# Patient Record
Sex: Male | Born: 1955 | Race: White | Hispanic: No | Marital: Single
Health system: Southern US, Community
[De-identification: ages and names within clinical notes are randomized; demographics above are authoritative.]

## PROBLEM LIST (undated history)

## (undated) DIAGNOSIS — J9621 Acute and chronic respiratory failure with hypoxia: Secondary | ICD-10-CM

## (undated) DIAGNOSIS — I469 Cardiac arrest, cause unspecified: Secondary | ICD-10-CM

## (undated) DIAGNOSIS — R6520 Severe sepsis without septic shock: Secondary | ICD-10-CM

## (undated) DIAGNOSIS — A419 Sepsis, unspecified organism: Secondary | ICD-10-CM

## (undated) DIAGNOSIS — J181 Lobar pneumonia, unspecified organism: Secondary | ICD-10-CM

## (undated) DIAGNOSIS — J449 Chronic obstructive pulmonary disease, unspecified: Secondary | ICD-10-CM

## (undated) DIAGNOSIS — J189 Pneumonia, unspecified organism: Secondary | ICD-10-CM

---

## 2019-04-16 ENCOUNTER — Other Ambulatory Visit (HOSPITAL_COMMUNITY): Payer: Self-pay

## 2019-04-16 ENCOUNTER — Inpatient Hospital Stay: Admission: RE | Admit: 2019-04-16 | Payer: Self-pay | Source: Other Acute Inpatient Hospital

## 2019-04-16 ENCOUNTER — Inpatient Hospital Stay
Admission: RE | Admit: 2019-04-16 | Discharge: 2019-05-02 | Disposition: A | Discharge status: Hospice | Payer: Medicare Other | Source: Other Acute Inpatient Hospital

## 2019-04-16 ENCOUNTER — Encounter: Payer: Self-pay | Admitting: Internal Medicine

## 2019-04-16 DIAGNOSIS — T85598A Other mechanical complication of other gastrointestinal prosthetic devices, implants and grafts, initial encounter: Secondary | ICD-10-CM

## 2019-04-16 DIAGNOSIS — J189 Pneumonia, unspecified organism: Secondary | ICD-10-CM | POA: Diagnosis present

## 2019-04-16 DIAGNOSIS — R6520 Severe sepsis without septic shock: Secondary | ICD-10-CM | POA: Diagnosis present

## 2019-04-16 DIAGNOSIS — J9621 Acute and chronic respiratory failure with hypoxia: Secondary | ICD-10-CM | POA: Diagnosis present

## 2019-04-16 DIAGNOSIS — J449 Chronic obstructive pulmonary disease, unspecified: Secondary | ICD-10-CM | POA: Diagnosis not present

## 2019-04-16 DIAGNOSIS — J181 Lobar pneumonia, unspecified organism: Secondary | ICD-10-CM

## 2019-04-16 DIAGNOSIS — I469 Cardiac arrest, cause unspecified: Secondary | ICD-10-CM | POA: Diagnosis not present

## 2019-04-16 DIAGNOSIS — J9690 Respiratory failure, unspecified, unspecified whether with hypoxia or hypercapnia: Secondary | ICD-10-CM

## 2019-04-16 DIAGNOSIS — A419 Sepsis, unspecified organism: Secondary | ICD-10-CM | POA: Diagnosis present

## 2019-04-16 HISTORY — DX: Cardiac arrest, cause unspecified: I46.9

## 2019-04-16 HISTORY — DX: Acute and chronic respiratory failure with hypoxia: J96.21

## 2019-04-16 HISTORY — DX: Severe sepsis without septic shock: R65.20

## 2019-04-16 HISTORY — DX: Sepsis, unspecified organism: A41.9

## 2019-04-16 HISTORY — DX: Lobar pneumonia, unspecified organism: J18.1

## 2019-04-16 HISTORY — DX: Pneumonia, unspecified organism: J18.9

## 2019-04-16 HISTORY — DX: Chronic obstructive pulmonary disease, unspecified: J44.9

## 2019-04-16 LAB — CBC WITH DIFFERENTIAL/PLATELET
Abs Immature Granulocytes: 0.26 10*3/uL — ABNORMAL HIGH (ref 0.00–0.07)
Basophils Absolute: 0 10*3/uL (ref 0.0–0.1)
Basophils Relative: 0 %
Eosinophils Absolute: 0 10*3/uL (ref 0.0–0.5)
Eosinophils Relative: 0 %
HCT: 46.5 % (ref 39.0–52.0)
Hemoglobin: 13.7 g/dL (ref 13.0–17.0)
Immature Granulocytes: 1 %
Lymphocytes Relative: 6 %
Lymphs Abs: 1.4 10*3/uL (ref 0.7–4.0)
MCH: 28.2 pg (ref 26.0–34.0)
MCHC: 29.5 g/dL — ABNORMAL LOW (ref 30.0–36.0)
MCV: 95.7 fL (ref 80.0–100.0)
Monocytes Absolute: 1.2 10*3/uL — ABNORMAL HIGH (ref 0.1–1.0)
Monocytes Relative: 6 %
Neutro Abs: 19.4 10*3/uL — ABNORMAL HIGH (ref 1.7–7.7)
Neutrophils Relative %: 87 %
Platelets: 268 10*3/uL (ref 150–400)
RBC: 4.86 MIL/uL (ref 4.22–5.81)
RDW: 19.4 % — ABNORMAL HIGH (ref 11.5–15.5)
WBC: 22.3 10*3/uL — ABNORMAL HIGH (ref 4.0–10.5)
nRBC: 0.7 % — ABNORMAL HIGH (ref 0.0–0.2)

## 2019-04-16 LAB — COMPREHENSIVE METABOLIC PANEL
ALT: 96 U/L — ABNORMAL HIGH (ref 0–44)
AST: 92 U/L — ABNORMAL HIGH (ref 15–41)
Albumin: 2.2 g/dL — ABNORMAL LOW (ref 3.5–5.0)
Alkaline Phosphatase: 128 U/L — ABNORMAL HIGH (ref 38–126)
Anion gap: 11 (ref 5–15)
BUN: 28 mg/dL — ABNORMAL HIGH (ref 8–23)
CO2: 33 mmol/L — ABNORMAL HIGH (ref 22–32)
Calcium: 8.8 mg/dL — ABNORMAL LOW (ref 8.9–10.3)
Chloride: 109 mmol/L (ref 98–111)
Creatinine, Ser: 1.05 mg/dL (ref 0.61–1.24)
GFR calc Af Amer: >60 mL/min (ref >60)
GFR calc non Af Amer: >60 mL/min (ref >60)
Glucose, Bld: 124 mg/dL — ABNORMAL HIGH (ref 70–99)
Potassium: 5.5 mmol/L — ABNORMAL HIGH (ref 3.5–5.1)
Sodium: 153 mmol/L — ABNORMAL HIGH (ref 135–145)
Total Bilirubin: 2.7 mg/dL — ABNORMAL HIGH (ref 0.3–1.2)
Total Protein: 6.4 g/dL — ABNORMAL LOW (ref 6.5–8.1)

## 2019-04-16 LAB — BLOOD GAS, ARTERIAL
Acid-Base Excess: 12.4 mmol/L — ABNORMAL HIGH (ref 0.0–2.0)
Bicarbonate: 37.3 mmol/L — ABNORMAL HIGH (ref 20.0–28.0)
Expiratory PAP: 5
FIO2: 50
Inspiratory PAP: 14
Mode: POSITIVE
O2 Saturation: 90.8 %
Patient temperature: 98.6
RATE: 16 resp/min
pCO2 arterial: 56.6 mmHg — ABNORMAL HIGH (ref 32.0–48.0)
pH, Arterial: 7.434 (ref 7.350–7.450)
pO2, Arterial: 63.9 mmHg — ABNORMAL LOW (ref 83.0–108.0)

## 2019-04-16 LAB — PROTIME-INR
INR: 1.3 — ABNORMAL HIGH (ref 0.8–1.2)
Prothrombin Time: 15.9 seconds — ABNORMAL HIGH (ref 11.4–15.2)

## 2019-04-16 MED ORDER — ACETAMINOPHEN 650 MG/20.3ML PO SOLN
650.00 | ORAL | Status: DC
Start: ? — End: 2019-04-16

## 2019-04-16 MED ORDER — FAMOTIDINE 20 MG/2ML IV SOLN
20.00 | INTRAVENOUS | Status: DC
Start: 2019-04-16 — End: 2019-04-16

## 2019-04-16 MED ORDER — GLUCOSE 40 % PO GEL
15.00 | ORAL | Status: DC
Start: ? — End: 2019-04-16

## 2019-04-16 MED ORDER — FP IBUPROFEN INFANTS PO
750.00 | ORAL | Status: DC
Start: 2019-04-16 — End: 2019-04-16

## 2019-04-16 MED ORDER — HEPARIN SODIUM (PORCINE) 5000 UNIT/ML IJ SOLN
5000.00 | INTRAMUSCULAR | Status: DC
Start: 2019-04-16 — End: 2019-04-16

## 2019-04-16 MED ORDER — LOVENOX 150 MG/ML ~~LOC~~ SOLN
0.50 | SUBCUTANEOUS | Status: DC
Start: ? — End: 2019-04-16

## 2019-04-16 MED ORDER — ONDANSETRON HCL 4 MG/2ML IJ SOLN
4.00 | INTRAMUSCULAR | Status: DC
Start: ? — End: 2019-04-16

## 2019-04-16 MED ORDER — DEXTROSE 5 % IV SOLN
INTRAVENOUS | Status: DC
Start: ? — End: 2019-04-16

## 2019-04-16 MED ORDER — DEXTROSE 10 % IV SOLN
125.00 | INTRAVENOUS | Status: DC
Start: ? — End: 2019-04-16

## 2019-04-16 MED ORDER — GLUCAGON HCL RDNA (DIAGNOSTIC) 1 MG IJ SOLR
1.00 | INTRAMUSCULAR | Status: DC
Start: ? — End: 2019-04-16

## 2019-04-16 MED ORDER — FUROSEMIDE 10 MG/ML IJ SOLN
20.00 | INTRAMUSCULAR | Status: DC
Start: 2019-04-16 — End: 2019-04-16

## 2019-04-16 MED ORDER — INSULIN LISPRO 100 UNIT/ML ~~LOC~~ SOLN
2.00 | SUBCUTANEOUS | Status: DC
Start: 2019-04-16 — End: 2019-04-16

## 2019-04-16 MED ORDER — FOLIC ACID 1 MG PO TABS
1.00 | ORAL_TABLET | ORAL | Status: DC
Start: 2019-04-16 — End: 2019-04-16

## 2019-04-16 MED ORDER — GENERIC EXTERNAL MEDICATION
1.00 | Status: DC
Start: 2019-04-16 — End: 2019-04-16

## 2019-04-16 MED ORDER — LORAZEPAM 2 MG/ML IJ SOLN
.50 | INTRAMUSCULAR | Status: DC
Start: ? — End: 2019-04-16

## 2019-04-16 MED ORDER — LACTULOSE 10 GM/15ML PO SOLN
30.00 | ORAL | Status: DC
Start: 2019-04-16 — End: 2019-04-16

## 2019-04-16 MED ORDER — HYDRALAZINE HCL 20 MG/ML IJ SOLN
10.00 | INTRAMUSCULAR | Status: DC
Start: ? — End: 2019-04-16

## 2019-04-16 NOTE — Consult Note (Signed)
Pulmonary Critical Care Medicine [facility] [place]  PULMONARY SERVICE  Date of Service: 04/16/2019  PULMONARY CRITICAL CARE CONSULT   Donald Crane  [id number]  DOB: 1956/02/22   DOA: 04/16/2019  Referring Physician: Carron Curie, MD  HPI: Donald Crane is a 63 y.o. male seen for follow up of Acute on Chronic Respiratory Failure.  Patient was transferred to our facility for further evaluation and management.  Has multiple medical problems including COPD hypertension BPH radiculopathy anxiety disorder was found at home unresponsive on the floor.  Reportedly had been having some difficulty breathing prior to this.  Family called the EMS he had a fairly basically refused transport saturations at that time noted in the 80s.  Patient was then found by the family EMS was called he was found to be in pulseless electrical activity down for about 10 minutes of CPR 1 dose of epinephrine and then had return of spontaneous circulation.  Subsequently blood cultures apparently grew Pasteurella multi-etc. patient was treated with appropriate antibiotics.  Patient also was septic and was treated with pressors which were eventually weaned off.  On 26 April patient was extubated and placed on BiPAP because he had increased work of breathing noted.  The patient subsequently spiked a fever temperature 100.2 transferred to our facility for further management.  At this time he is on BiPAP 14/5  Review of Systems:  ROS performed and is unremarkable other than noted above.  Medical History: Past Medical History:  Diagnosis Date  . 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 Anxiety  . Arthritis  low back  . Benign neoplasm of colon 01/06/2016  . BPH (benign prostatic hyperplasia)  . BPH without urinary obstruction 01/06/2016  . Cervical radiculopathy  . COPD (chronic obstructive pulmonary disease) (HCC)  . GERD (gastroesophageal reflux disease)  . Hypertension  . Insomnia  . 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 Prostatitis, acute 01/06/2016  . Vitamin D deficiency 01/06/2016   Surgical History: Past Surgical History:  Procedure Laterality Date  . CERVICAL EPIDURAL STEROID INJECTION  . CHOLECYSTECTOMY  . COLONOSCOPY N/A 05/31/2018  Procedure: COLONOSCOPY; Surgeon: Chaney Born, MD; Location: [facility] ENDO OR; Service: Gastroenterology; Laterality: N/A;  . HERNIA REPAIR  abdominal   Social History: Social History   Socioeconomic History  . Marital status: Divorced  Spouse name: Not on file  . Number of children: Not on file  . Years of education: Not on file  . Highest education level: Not on file  Occupational History  . Not on file  Social Needs  . Financial resource strain: Not on file  . Food insecurity  Worry: Not on file  Inability: Not on file  . Transportation needs  Medical: Not on file  Non-medical: Not on file  Tobacco Use  . Smoking status: Former Smoker  Years: 40.00  Last attempt to quit: 05/28/2012  Years since quitting: 6.8  . Smokeless tobacco: Never Used  Substance and Sexual Activity  . Alcohol use: No  . Drug use: No  . Sexual activity: Not on file  Lifestyle  . Physical activity  Days per week: Not on file  Minutes per session: Not on file  . Stress: Not on file  Relationships  . Social Multimedia programmer on phone: Not on file  Gets together: Not on file  Attends religious service: Not on file  Active member of club or organization: Not on file  Attends meetings of clubs or organizations: Not on file  Relationship status: Not on file  Other Topics Concern  . Not on file  Social History Narrative  . Not on file   Family History: Family History  Problem Relation Age of Onset  . Dementia Mother  . Migraines Mother  . Heart disease Father    Medications: Reviewed on Rounds  Physical Exam:  Vitals: Temperature 99.2 pulse 92 respiratory rate is 30 blood pressure 160/96 saturations 94%  Ventilator Settings currently on BiPAP  FiO2 40% pressure inspiratory 14 expiratory pressure 5 tidal volume 791  . General: Comfortable at this time . Eyes: Grossly normal lids, irises & conjunctiva . ENT: grossly tongue is normal . Neck: no obvious mass . Cardiovascular: S1-S2 normal no gallop or rub . Respiratory: Coarse breath sounds with a few rhonchi . Abdomen: Soft and nontender . Skin: no rash seen on limited exam . Musculoskeletal: not rigid . Psychiatric:unable to assess . Neurologic: no seizure no involuntary movements         Labs on Admission:  Basic Metabolic Panel: Recent Labs  Lab 04/16/19 0716  NA 153*  K 5.5*  CL 109  CO2 33*  GLUCOSE 124*  BUN 28*  CREATININE 1.05  CALCIUM 8.8*    Recent Labs  Lab 04/16/19 0215  PHART 7.434  PCO2ART 56.6*  PO2ART 63.9*  HCO3 37.3*  O2SAT 90.8    Liver Function Tests: Recent Labs  Lab 04/16/19 0716  AST 92*  ALT 96*  ALKPHOS 128*  BILITOT 2.7*  PROT 6.4*  ALBUMIN 2.2*   No results for input(s): LIPASE, AMYLASE in the last 168 hours. No results for input(s): AMMONIA in the last 168 hours.  CBC: Recent Labs  Lab 04/16/19 0716  WBC 22.3*  NEUTROABS 19.4*  HGB 13.7  HCT 46.5  MCV 95.7  PLT 268    Cardiac Enzymes: No results for input(s): CKTOTAL, CKMB, CKMBINDEX, TROPONINI in the last 168 hours.  BNP (last 3 results) No results for input(s): BNP in the last 8760 hours.  ProBNP (last 3 results) No results for input(s): PROBNP in the last 8760 hours.   Radiological Exams on Admission: Dg Chest Port 1 View  Result Date: 04/16/2019 CLINICAL DATA:  Shortness of breath.  Respiratory failure. EXAM: PORTABLE CHEST 1 VIEW COMPARISON:  None FINDINGS: Heart size is exaggerate by low lung volumes. NG tube is in place. Bibasilar airspace disease is noted. IMPRESSION: 1. Low lung volumes with bibasilar airspace disease. Infection is not excluded. Electronically Signed   By: Marin Robertshristopher  Mattern M.D.   On: 04/16/2019 07:12   Dg Abd Portable  1v  Result Date: 04/16/2019 CLINICAL DATA:  Impaired nasogastric feeding tube, initial encounter. EXAM: PORTABLE ABDOMEN - 1 VIEW COMPARISON:  None. FINDINGS: The tip of the NG tube is in the antrum of the stomach. Bowel gas pattern is normal. IMPRESSION: Tip of the NG tube is identified in the distal stomach. Electronically Signed   By: Marin Robertshristopher  Mattern M.D.   On: 04/16/2019 05:21    Assessment/Plan Active Problems:   Acute on chronic respiratory failure with hypoxia (HCC)   Cardiac arrest (HCC)   Severe sepsis (HCC)   Community acquired pneumonia of both lower lobes (HCC)   COPD, severe (HCC)   1. Acute on chronic respiratory failure with hypoxia patient is still on BiPAP which has been relatively continues.  If there is no improvement would probably recommend doing intubation.  Last chest x-ray had shown bibasilar airspace disease possibility of pneumonia is still there. 2. Cardiac arrest  on presentation rhythm since has been stable we will continue to monitor. 3. Sepsis with gram-negative bacteremia treated continue with supportive care we will continue to monitor closely. 4. Severe COPD right now is at baseline nebulizers as necessary. 5. Community-acquired pneumonia chest x-ray still showing some bilateral airspace disease antibiotics are reviewed with the primary care team  I have personally seen and evaluated the patient, evaluated laboratory and imaging results, formulated the assessment and plan and placed orders. The Patient requires high complexity decision making for assessment and support.  Case was discussed on Rounds with the Respiratory Therapy Staff Time Spent  Yevonne Pax, MD [facility] Pulmonary Critical Care Medicine Sleep Medicine

## 2019-04-17 DIAGNOSIS — I469 Cardiac arrest, cause unspecified: Secondary | ICD-10-CM | POA: Diagnosis not present

## 2019-04-17 DIAGNOSIS — J449 Chronic obstructive pulmonary disease, unspecified: Secondary | ICD-10-CM | POA: Diagnosis not present

## 2019-04-17 DIAGNOSIS — J9621 Acute and chronic respiratory failure with hypoxia: Secondary | ICD-10-CM | POA: Diagnosis not present

## 2019-04-17 DIAGNOSIS — J181 Lobar pneumonia, unspecified organism: Secondary | ICD-10-CM | POA: Diagnosis not present

## 2019-04-17 LAB — CBC
HCT: 45.7 % (ref 39.0–52.0)
Hemoglobin: 13.7 g/dL (ref 13.0–17.0)
MCH: 29.4 pg (ref 26.0–34.0)
MCHC: 30 g/dL (ref 30.0–36.0)
MCV: 98.1 fL (ref 80.0–100.0)
Platelets: 264 10*3/uL (ref 150–400)
RBC: 4.66 MIL/uL (ref 4.22–5.81)
RDW: 18.8 % — ABNORMAL HIGH (ref 11.5–15.5)
WBC: 19.3 10*3/uL — ABNORMAL HIGH (ref 4.0–10.5)
nRBC: 0.3 % — ABNORMAL HIGH (ref 0.0–0.2)

## 2019-04-17 LAB — BASIC METABOLIC PANEL
Anion gap: 11 (ref 5–15)
BUN: 28 mg/dL — ABNORMAL HIGH (ref 8–23)
CO2: 34 mmol/L — ABNORMAL HIGH (ref 22–32)
Calcium: 8.7 mg/dL — ABNORMAL LOW (ref 8.9–10.3)
Chloride: 111 mmol/L (ref 98–111)
Creatinine, Ser: 1.11 mg/dL (ref 0.61–1.24)
GFR calc Af Amer: >60 mL/min (ref >60)
GFR calc non Af Amer: >60 mL/min (ref >60)
Glucose, Bld: 114 mg/dL — ABNORMAL HIGH (ref 70–99)
Potassium: 3.7 mmol/L (ref 3.5–5.1)
Sodium: 156 mmol/L — ABNORMAL HIGH (ref 135–145)

## 2019-04-17 LAB — MAGNESIUM: Magnesium: 2.2 mg/dL (ref 1.7–2.4)

## 2019-04-17 NOTE — Progress Notes (Addendum)
Pulmonary Critical Care Medicine [facility] [place]   PULMONARY CRITICAL CARE SERVICE  PROGRESS NOTE  Date of Service: 04/17/2019  Donald Crane  [id number]  DOB: 10/07/1956   DOA: 04/16/2019  Referring Physician: Carron Curie, MD  HPI: Donald Crane is a 63 y.o. male seen for follow up of Acute on Chronic Respiratory Failure.  Patient remains on Ventimask at 50% FiO2.  Currently satting well with no distress.  Medications: Reviewed on Rounds  Physical Exam:  Vitals: Pulse 97 respirations 23 BP 156/81 O2 sat 95% temp 97.5  Ventilator Settings Ventimask 50%  . General: Comfortable at this time . Eyes: Grossly normal lids, irises & conjunctiva . ENT: grossly tongue is normal . Neck: no obvious mass . Cardiovascular: S1 S2 normal no gallop . Respiratory: Coarse breath sounds . Abdomen: soft . Skin: no rash seen on limited exam . Musculoskeletal: not rigid . Psychiatric:unable to assess . Neurologic: no seizure no involuntary movements         Lab Data:   Basic Metabolic Panel: Recent Labs  Lab 04/16/19 0716 04/17/19 0500  NA 153* 156*  K 5.5* 3.7  CL 109 111  CO2 33* 34*  GLUCOSE 124* 114*  BUN 28* 28*  CREATININE 1.05 1.11  CALCIUM 8.8* 8.7*  MG  --  2.2    ABG: Recent Labs  Lab 04/16/19 0215  PHART 7.434  PCO2ART 56.6*  PO2ART 63.9*  HCO3 37.3*  O2SAT 90.8    Liver Function Tests: Recent Labs  Lab 04/16/19 0716  AST 92*  ALT 96*  ALKPHOS 128*  BILITOT 2.7*  PROT 6.4*  ALBUMIN 2.2*   No results for input(s): LIPASE, AMYLASE in the last 168 hours. No results for input(s): AMMONIA in the last 168 hours.  CBC: Recent Labs  Lab 04/16/19 0716 04/17/19 0500  WBC 22.3* 19.3*  NEUTROABS 19.4*  --   HGB 13.7 13.7  HCT 46.5 45.7  MCV 95.7 98.1  PLT 268 264    Cardiac Enzymes: No results for input(s): CKTOTAL, CKMB, CKMBINDEX, TROPONINI in the last 168 hours.  BNP (last 3 results) No results for input(s): BNP in the last  8760 hours.  ProBNP (last 3 results) No results for input(s): PROBNP in the last 8760 hours.  Radiological Exams: Dg Chest Port 1 View  Result Date: 04/16/2019 CLINICAL DATA:  Shortness of breath.  Respiratory failure. EXAM: PORTABLE CHEST 1 VIEW COMPARISON:  None FINDINGS: Heart size is exaggerate by low lung volumes. NG tube is in place. Bibasilar airspace disease is noted. IMPRESSION: 1. Low lung volumes with bibasilar airspace disease. Infection is not excluded. Electronically Signed   By: Marin Roberts M.D.   On: 04/16/2019 07:12   Dg Abd Portable 1v  Result Date: 04/16/2019 CLINICAL DATA:  Impaired nasogastric feeding tube, initial encounter. EXAM: PORTABLE ABDOMEN - 1 VIEW COMPARISON:  None. FINDINGS: The tip of the NG tube is in the antrum of the stomach. Bowel gas pattern is normal. IMPRESSION: Tip of the NG tube is identified in the distal stomach. Electronically Signed   By: Marin Roberts M.D.   On: 04/16/2019 05:21    Assessment/Plan Active Problems:   Acute on chronic respiratory failure with hypoxia (HCC)   Cardiac arrest (HCC)   Severe sepsis (HCC)   Community acquired pneumonia of both lower lobes (HCC)   COPD, severe (HCC)   1. Acute on chronic respiratory failure with hypoxia patient currently on Ventimask 50% FiO2.  Continue to wean oxygen as tolerated.  Continue pulmonary toilet and secretion management 2. Cardiac arrest on presentation rhythm stable since then. 3. Sepsis with gram-negative bacteria treated continue supportive care 4. Severe COPD at baseline continue nebulizers as necessary 5. Community-acquired pneumonia continue to monitor and follow-up with primary care team.   I have personally seen and evaluated the patient, evaluated laboratory and imaging results, formulated the assessment and plan and placed orders. The Patient requires high complexity decision making for assessment and support.  Case was discussed on Rounds with the  Respiratory Therapy Staff  Yevonne PaxSaadat A Ramani Riva, MD [facility] Pulmonary Critical Care Medicine Sleep Medicine

## 2019-04-18 DIAGNOSIS — I469 Cardiac arrest, cause unspecified: Secondary | ICD-10-CM | POA: Diagnosis not present

## 2019-04-18 DIAGNOSIS — J449 Chronic obstructive pulmonary disease, unspecified: Secondary | ICD-10-CM | POA: Diagnosis not present

## 2019-04-18 DIAGNOSIS — J181 Lobar pneumonia, unspecified organism: Secondary | ICD-10-CM | POA: Diagnosis not present

## 2019-04-18 DIAGNOSIS — J9621 Acute and chronic respiratory failure with hypoxia: Secondary | ICD-10-CM | POA: Diagnosis not present

## 2019-04-18 NOTE — Progress Notes (Addendum)
Pulmonary Critical Care Medicine [facility] [place]   PULMONARY CRITICAL CARE SERVICE  PROGRESS NOTE  Date of Service: 04/18/2019  Donald Crane  [id number]  DOB: [birth year]   DOA: 04/16/2019  Referring Physician: Carron Curie, MD  HPI: Donald Crane is a 63 y.o. male seen for follow up of Acute on Chronic Respiratory Failure.  Patient had declined Ventimask and be placed on BiPAP overnight last night due to desaturations and increased work of breathing.  Patient was placed back on Ventimask at 50% this morning and appears to be doing well with no distress and good saturations.  Medications: Reviewed on Rounds  Physical Exam:  Vitals: Pulse 95 respirations 30 BP 170/90 O2 sat 95% temp 99.1  Ventilator Settings 50% Ventimask  . General: Comfortable at this time . Eyes: Grossly normal lids, irises & conjunctiva . ENT: grossly tongue is normal . Neck: no obvious mass . Cardiovascular: S1 S2 normal no gallop . Respiratory: Coarse breath sounds . Abdomen: soft . Skin: no rash seen on limited exam . Musculoskeletal: not rigid . Psychiatric:unable to assess . Neurologic: no seizure no involuntary movements         Lab Data:   Basic Metabolic Panel: Recent Labs  Lab 04/16/19 0716 04/17/19 0500  NA 153* 156*  K 5.5* 3.7  CL 109 111  CO2 33* 34*  GLUCOSE 124* 114*  BUN 28* 28*  CREATININE 1.05 1.11  CALCIUM 8.8* 8.7*  MG  --  2.2    ABG: Recent Labs  Lab 04/16/19 0215  PHART 7.434  PCO2ART 56.6*  PO2ART 63.9*  HCO3 37.3*  O2SAT 90.8    Liver Function Tests: Recent Labs  Lab 04/16/19 0716  AST 92*  ALT 96*  ALKPHOS 128*  BILITOT 2.7*  PROT 6.4*  ALBUMIN 2.2*   No results for input(s): LIPASE, AMYLASE in the last 168 hours. No results for input(s): AMMONIA in the last 168 hours.  CBC: Recent Labs  Lab 04/16/19 0716 04/17/19 0500  WBC 22.3* 19.3*  NEUTROABS 19.4*  --   HGB 13.7 13.7  HCT 46.5 45.7  MCV 95.7 98.1  PLT 268 264     Cardiac Enzymes: No results for input(s): CKTOTAL, CKMB, CKMBINDEX, TROPONINI in the last 168 hours.  BNP (last 3 results) No results for input(s): BNP in the last 8760 hours.  ProBNP (last 3 results) No results for input(s): PROBNP in the last 8760 hours.  Radiological Exams: No results found.  Assessment/Plan Active Problems:   Acute on chronic respiratory failure with hypoxia (HCC)   Cardiac arrest (HCC)   Severe sepsis (HCC)   Community acquired pneumonia of both lower lobes (HCC)   COPD, severe (HCC)   1. Acute on chronic respiratory failure with hypoxia patient remains on a Ventimask 50% at this time.  Continue to wean oxygen as tolerated.  Continue secretion management pulmonary toilet 2. Cardiac arrest rhythm is stable 3. Sepsis with gram-negative bacteria continue supportive care 4. Severe COPD at baseline continue nebulizers as necessary 5. Community-acquired pneumonia continue to monitor follow-up primary care team   I have personally seen and evaluated the patient, evaluated laboratory and imaging results, formulated the assessment and plan and placed orders. The Patient requires high complexity decision making for assessment and support.  Case was discussed on Rounds with the Respiratory Therapy Staff  Yevonne Pax, MD [facility] Pulmonary Critical Care Medicine Sleep Medicine

## 2019-04-19 DIAGNOSIS — J181 Lobar pneumonia, unspecified organism: Secondary | ICD-10-CM | POA: Diagnosis not present

## 2019-04-19 DIAGNOSIS — J9621 Acute and chronic respiratory failure with hypoxia: Secondary | ICD-10-CM | POA: Diagnosis not present

## 2019-04-19 DIAGNOSIS — I469 Cardiac arrest, cause unspecified: Secondary | ICD-10-CM | POA: Diagnosis not present

## 2019-04-19 DIAGNOSIS — J449 Chronic obstructive pulmonary disease, unspecified: Secondary | ICD-10-CM | POA: Diagnosis not present

## 2019-04-19 NOTE — Progress Notes (Addendum)
Pulmonary Critical Care Medicine [facility] [place]   PULMONARY CRITICAL CARE SERVICE  PROGRESS NOTE  Date of Service: 04/19/2019  Donald Crane  [id number]  DOB: 02-28-1956   DOA: 04/16/2019  Referring Physician: Carron Curie, MD  HPI: Donald Crane is a 63 y.o. male seen for follow up of Acute on Chronic Respiratory Failure.  Is on BiPAP 14/5 40% FiO2 resting comfortably no distress.  Medications: Reviewed on Rounds  Physical Exam:  Vitals: Pulse 88 respirations 34 BP 176/91 O2 sat 96% temp 97.7  Ventilator Settings BiPAP 14/5 and 40%  . General: Comfortable at this time . Eyes: Grossly normal lids, irises & conjunctiva . ENT: grossly tongue is normal . Neck: no obvious mass . Cardiovascular: S1 S2 normal no gallop . Respiratory: No rales or rhonchi noted . Abdomen: soft . Skin: no rash seen on limited exam . Musculoskeletal: not rigid . Psychiatric:unable to assess . Neurologic: no seizure no involuntary movements         Lab Data:   Basic Metabolic Panel: Recent Labs  Lab 04/16/19 0716 04/17/19 0500  NA 153* 156*  K 5.5* 3.7  CL 109 111  CO2 33* 34*  GLUCOSE 124* 114*  BUN 28* 28*  CREATININE 1.05 1.11  CALCIUM 8.8* 8.7*  MG  --  2.2    ABG: Recent Labs  Lab 04/16/19 0215  PHART 7.434  PCO2ART 56.6*  PO2ART 63.9*  HCO3 37.3*  O2SAT 90.8    Liver Function Tests: Recent Labs  Lab 04/16/19 0716  AST 92*  ALT 96*  ALKPHOS 128*  BILITOT 2.7*  PROT 6.4*  ALBUMIN 2.2*   No results for input(s): LIPASE, AMYLASE in the last 168 hours. No results for input(s): AMMONIA in the last 168 hours.  CBC: Recent Labs  Lab 04/16/19 0716 04/17/19 0500  WBC 22.3* 19.3*  NEUTROABS 19.4*  --   HGB 13.7 13.7  HCT 46.5 45.7  MCV 95.7 98.1  PLT 268 264    Cardiac Enzymes: No results for input(s): CKTOTAL, CKMB, CKMBINDEX, TROPONINI in the last 168 hours.  BNP (last 3 results) No results for input(s): BNP in the last 8760  hours.  ProBNP (last 3 results) No results for input(s): PROBNP in the last 8760 hours.  Radiological Exams: No results found.  Assessment/Plan Active Problems:   Acute on chronic respiratory failure with hypoxia (HCC)   Cardiac arrest (HCC)   Severe sepsis (HCC)   Community acquired pneumonia of both lower lobes (HCC)   COPD, severe (HCC)   1. Acute on chronic respiratory failure with hypoxia patient remains on BiPAP will be switched to Ventimask 50% today.  Continue to wean as tolerated.  Continue pulmonary toilet and secretion management 2. Cardiac arrest rhythm stable 3. Sepsis with gram-negative bacteria continue supportive care 4. Severe COPD at baseline continue nebulizers as necessary 5. Community-acquired pneumonia continue to monitor follow-up   I have personally seen and evaluated the patient, evaluated laboratory and imaging results, formulated the assessment and plan and placed orders. The Patient requires high complexity decision making for assessment and support.  Case was discussed on Rounds with the Respiratory Therapy Staff  Yevonne Pax, MD [facility] Pulmonary Critical Care Medicine Sleep Medicine

## 2019-04-20 DIAGNOSIS — J449 Chronic obstructive pulmonary disease, unspecified: Secondary | ICD-10-CM | POA: Diagnosis not present

## 2019-04-20 DIAGNOSIS — I469 Cardiac arrest, cause unspecified: Secondary | ICD-10-CM | POA: Diagnosis not present

## 2019-04-20 DIAGNOSIS — J181 Lobar pneumonia, unspecified organism: Secondary | ICD-10-CM | POA: Diagnosis not present

## 2019-04-20 DIAGNOSIS — J9621 Acute and chronic respiratory failure with hypoxia: Secondary | ICD-10-CM | POA: Diagnosis not present

## 2019-04-20 LAB — BASIC METABOLIC PANEL
Anion gap: 9 (ref 5–15)
BUN: 21 mg/dL (ref 8–23)
CO2: 38 mmol/L — ABNORMAL HIGH (ref 22–32)
Calcium: 8.3 mg/dL — ABNORMAL LOW (ref 8.9–10.3)
Chloride: 97 mmol/L — ABNORMAL LOW (ref 98–111)
Creatinine, Ser: 0.84 mg/dL (ref 0.61–1.24)
GFR calc Af Amer: >60 mL/min (ref >60)
GFR calc non Af Amer: >60 mL/min (ref >60)
Glucose, Bld: 92 mg/dL (ref 70–99)
Potassium: 3 mmol/L — ABNORMAL LOW (ref 3.5–5.1)
Sodium: 144 mmol/L (ref 135–145)

## 2019-04-20 NOTE — Progress Notes (Addendum)
Pulmonary Critical Care Medicine [facility] [place]   PULMONARY CRITICAL CARE SERVICE  PROGRESS NOTE  Date of Service: 04/20/2019  Donald Crane  [id number]  DOB: Apr 18, 1956   DOA: 04/16/2019  Referring Physician: Carron Curie, MD  HPI: Donald Crane is a 63 y.o. male seen for follow up of Acute on Chronic Respiratory Failure.  Patient remains on Ventimask 50% with good saturations noted.  Medications: Reviewed on Rounds  Physical Exam:  Vitals: Pulse 91 respirations 28 BP 150/81 O2 sat 97% temp 99.5  Ventilator Settings Ventimask 50%  . General: Comfortable at this time . Eyes: Grossly normal lids, irises & conjunctiva . ENT: grossly tongue is normal . Neck: no obvious mass . Cardiovascular: S1 S2 normal no gallop . Respiratory: No rales or rhonchi noted . Abdomen: soft . Skin: no rash seen on limited exam . Musculoskeletal: not rigid . Psychiatric:unable to assess . Neurologic: no seizure no involuntary movements         Lab Data:   Basic Metabolic Panel: Recent Labs  Lab 04/16/19 0716 04/17/19 0500 04/20/19 0523  NA 153* 156* 144  K 5.5* 3.7 3.0*  CL 109 111 97*  CO2 33* 34* 38*  GLUCOSE 124* 114* 92  BUN 28* 28* 21  CREATININE 1.05 1.11 0.84  CALCIUM 8.8* 8.7* 8.3*  MG  --  2.2  --     ABG: Recent Labs  Lab 04/16/19 0215  PHART 7.434  PCO2ART 56.6*  PO2ART 63.9*  HCO3 37.3*  O2SAT 90.8    Liver Function Tests: Recent Labs  Lab 04/16/19 0716  AST 92*  ALT 96*  ALKPHOS 128*  BILITOT 2.7*  PROT 6.4*  ALBUMIN 2.2*   No results for input(s): LIPASE, AMYLASE in the last 168 hours. No results for input(s): AMMONIA in the last 168 hours.  CBC: Recent Labs  Lab 04/16/19 0716 04/17/19 0500  WBC 22.3* 19.3*  NEUTROABS 19.4*  --   HGB 13.7 13.7  HCT 46.5 45.7  MCV 95.7 98.1  PLT 268 264    Cardiac Enzymes: No results for input(s): CKTOTAL, CKMB, CKMBINDEX, TROPONINI in the last 168 hours.  BNP (last 3 results) No  results for input(s): BNP in the last 8760 hours.  ProBNP (last 3 results) No results for input(s): PROBNP in the last 8760 hours.  Radiological Exams: No results found.  Assessment/Plan Active Problems:   Acute on chronic respiratory failure with hypoxia (HCC)   Cardiac arrest (HCC)   Severe sepsis (HCC)   Community acquired pneumonia of both lower lobes (HCC)   COPD, severe (HCC)   1. Acute on chronic respiratory failure with hypoxia continue with Ventimask and BiPAP as needed.  Continue to wean as tolerated.  Continue secretion management pulmonary toilet 2. Cardiac arrest rhythm stable 3. Sepsis with gram-negative bacteria continue supportive care 4. Severe COPD at baseline continue nebulizers 5. Community-acquired pneumonia continue to follow   I have personally seen and evaluated the patient, evaluated laboratory and imaging results, formulated the assessment and plan and placed orders. The Patient requires high complexity decision making for assessment and support.  Case was discussed on Rounds with the Respiratory Therapy Staff  Yevonne Pax, MD [facility] Pulmonary Critical Care Medicine Sleep Medicine

## 2019-04-21 DIAGNOSIS — J449 Chronic obstructive pulmonary disease, unspecified: Secondary | ICD-10-CM | POA: Diagnosis not present

## 2019-04-21 DIAGNOSIS — I469 Cardiac arrest, cause unspecified: Secondary | ICD-10-CM | POA: Diagnosis not present

## 2019-04-21 DIAGNOSIS — J9621 Acute and chronic respiratory failure with hypoxia: Secondary | ICD-10-CM | POA: Diagnosis not present

## 2019-04-21 DIAGNOSIS — J181 Lobar pneumonia, unspecified organism: Secondary | ICD-10-CM | POA: Diagnosis not present

## 2019-04-21 LAB — POTASSIUM: Potassium: 4.5 mmol/L (ref 3.5–5.1)

## 2019-04-21 NOTE — Progress Notes (Addendum)
Pulmonary Critical Care Medicine [facility] [place]   PULMONARY CRITICAL CARE SERVICE  PROGRESS NOTE  Date of Service: 04/21/2019  Donald Crane  [id number]  DOB: 1956-01-10   DOA: 04/16/2019  Referring Physician: Carron Curie, MD  HPI: Donald Crane is a 63 y.o. male seen for follow up of Acute on Chronic Respiratory Failure.  Patient currently on 5 L via Oxymizer.  Satting well  Medications: Reviewed on Rounds  Physical Exam:  Vitals: Pulse 86 respirations 22 BP 119/71 O2 sat 9%  Ventilator Settings 5 L oxygen  . General: Comfortable at this time . Eyes: Grossly normal lids, irises & conjunctiva . ENT: grossly tongue is normal . Neck: no obvious mass . Cardiovascular: S1 S2 normal no gallop . Respiratory: No rales or rhonchi noted . Abdomen: soft . Skin: no rash seen on limited exam . Musculoskeletal: not rigid . Psychiatric:unable to assess . Neurologic: no seizure no involuntary movements         Lab Data:   Basic Metabolic Panel: Recent Labs  Lab 04/16/19 0716 04/17/19 0500 04/20/19 0523 04/21/19 0659  NA 153* 156* 144  --   K 5.5* 3.7 3.0* 4.5  CL 109 111 97*  --   CO2 33* 34* 38*  --   GLUCOSE 124* 114* 92  --   BUN 28* 28* 21  --   CREATININE 1.05 1.11 0.84  --   CALCIUM 8.8* 8.7* 8.3*  --   MG  --  2.2  --   --     ABG: Recent Labs  Lab 04/16/19 0215  PHART 7.434  PCO2ART 56.6*  PO2ART 63.9*  HCO3 37.3*  O2SAT 90.8    Liver Function Tests: Recent Labs  Lab 04/16/19 0716  AST 92*  ALT 96*  ALKPHOS 128*  BILITOT 2.7*  PROT 6.4*  ALBUMIN 2.2*   No results for input(s): LIPASE, AMYLASE in the last 168 hours. No results for input(s): AMMONIA in the last 168 hours.  CBC: Recent Labs  Lab 04/16/19 0716 04/17/19 0500  WBC 22.3* 19.3*  NEUTROABS 19.4*  --   HGB 13.7 13.7  HCT 46.5 45.7  MCV 95.7 98.1  PLT 268 264    Cardiac Enzymes: No results for input(s): CKTOTAL, CKMB, CKMBINDEX, TROPONINI in the last 168  hours.  BNP (last 3 results) No results for input(s): BNP in the last 8760 hours.  ProBNP (last 3 results) No results for input(s): PROBNP in the last 8760 hours.  Radiological Exams: No results found.  Assessment/Plan Active Problems:   Acute on chronic respiratory failure with hypoxia (HCC)   Cardiac arrest (HCC)   Severe sepsis (HCC)   Community acquired pneumonia of both lower lobes (HCC)   COPD, severe (HCC)   1. Acute on chronic respiratory failure hypoxia continue with BiPAP as needed.  Continue to wean as tolerated continue secretion management and pulmonary toilet cardiac arrest rhythm stable 2. Sepsis with gram-negative bacteria continue severe COPD at baseline acquired pneumonia   I have personally seen and evaluated the patient, evaluated laboratory and imaging results, formulated the assessment and plan and placed orders. The Patient requires high complexity decision making for assessment and support.  Case was discussed on Rounds with the Respiratory Therapy Staff  Yevonne Pax, MD [place] Pulmonary Critical Care Medicine Sleep Medicine

## 2019-04-22 DIAGNOSIS — J181 Lobar pneumonia, unspecified organism: Secondary | ICD-10-CM | POA: Diagnosis not present

## 2019-04-22 DIAGNOSIS — I469 Cardiac arrest, cause unspecified: Secondary | ICD-10-CM | POA: Diagnosis not present

## 2019-04-22 DIAGNOSIS — J449 Chronic obstructive pulmonary disease, unspecified: Secondary | ICD-10-CM | POA: Diagnosis not present

## 2019-04-22 DIAGNOSIS — J9621 Acute and chronic respiratory failure with hypoxia: Secondary | ICD-10-CM | POA: Diagnosis not present

## 2019-04-22 LAB — BLOOD GAS, ARTERIAL
Acid-Base Excess: 18.4 mmol/L — ABNORMAL HIGH (ref 0.0–2.0)
Bicarbonate: 43.9 mmol/L — ABNORMAL HIGH (ref 20.0–28.0)
O2 Content: 7 L/min
O2 Saturation: 94.5 %
Patient temperature: 98.6
pCO2 arterial: 65.3 mmHg (ref 32.0–48.0)
pH, Arterial: 7.443 (ref 7.350–7.450)
pO2, Arterial: 73.4 mmHg — ABNORMAL LOW (ref 83.0–108.0)

## 2019-04-22 LAB — URINALYSIS, ROUTINE W REFLEX MICROSCOPIC
Bilirubin Urine: NEGATIVE
Glucose, UA: NEGATIVE mg/dL
Ketones, ur: NEGATIVE mg/dL
Nitrite: NEGATIVE
Protein, ur: 30 mg/dL — AB
Specific Gravity, Urine: 1.023 (ref 1.005–1.030)
pH: 6 (ref 5.0–8.0)

## 2019-04-22 NOTE — Progress Notes (Addendum)
Pulmonary Critical Care Medicine [facility] [place]   PULMONARY CRITICAL CARE SERVICE  PROGRESS NOTE  Date of Service: 04/22/2019  Donald Crane  RSW:[id number]  DOB: [birth year]   DOA: 04/16/2019  Referring Physician: Carron Curie, MD  HPI: Donald Crane is a 63 y.o. male seen for follow up of Acute on Chronic Respiratory Failure.  Patient is currently on 5 L of oxygen via Oxymizer.  Satting well with no distress at this time.   Medications: Reviewed on Rounds  Physical Exam:  Vitals:  Pulse 94 respirations 18 BP 157/87 O2 sat 99% temp 98.4  Ventilator Settings 5 L Oxymizer  . General: Comfortable at this time . Eyes: Grossly normal lids, irises & conjunctiva . ENT: grossly tongue is normal . Neck: no obvious mass . Cardiovascular: S1 S2 normal no gallop . Respiratory: No rales or rhonchi noted . Abdomen: soft . Skin: no rash seen on limited exam . Musculoskeletal: not rigid . Psychiatric:unable to assess . Neurologic: no seizure no involuntary movements         Lab Data:   Basic Metabolic Panel: Recent Labs  Lab 04/16/19 0716 04/17/19 0500 04/20/19 0523 04/21/19 0659  NA 153* 156* 144  --   K 5.5* 3.7 3.0* 4.5  CL 109 111 97*  --   CO2 33* 34* 38*  --   GLUCOSE 124* 114* 92  --   BUN 28* 28* 21  --   CREATININE 1.05 1.11 0.84  --   CALCIUM 8.8* 8.7* 8.3*  --   MG  --  2.2  --   --     ABG: Recent Labs  Lab 04/16/19 0215  PHART 7.434  PCO2ART 56.6*  PO2ART 63.9*  HCO3 37.3*  O2SAT 90.8    Liver Function Tests: Recent Labs  Lab 04/16/19 0716  AST 92*  ALT 96*  ALKPHOS 128*  BILITOT 2.7*  PROT 6.4*  ALBUMIN 2.2*   No results for input(s): LIPASE, AMYLASE in the last 168 hours. No results for input(s): AMMONIA in the last 168 hours.  CBC: Recent Labs  Lab 04/16/19 0716 04/17/19 0500  WBC 22.3* 19.3*  NEUTROABS 19.4*  --   HGB 13.7 13.7  HCT 46.5 45.7  MCV 95.7 98.1  PLT 268 264    Cardiac Enzymes: No results for  input(s): CKTOTAL, CKMB, CKMBINDEX, TROPONINI in the last 168 hours.  BNP (last 3 results) No results for input(s): BNP in the last 8760 hours.  ProBNP (last 3 results) No results for input(s): PROBNP in the last 8760 hours.  Radiological Exams: No results found.  Assessment/Plan Active Problems:   Acute on chronic respiratory failure with hypoxia (HCC)   Cardiac arrest (HCC)   Severe sepsis (HCC)   Community acquired pneumonia of both lower lobes (HCC)   COPD, severe (HCC)   1. Acute on chronic respiratory failure with hypoxia continue with BiPAP as needed.  Continue to wean as tolerated.  Continue supportive measures and secretion management 2. Cardiac arrest rhythm stable 3. Sepsis with gram-negative bacteria continue supportive care 4. Severe COPD at baseline continue present management 5. Community-acquired pneumonia continue to follow   I have personally seen and evaluated the patient, evaluated laboratory and imaging results, formulated the assessment and plan and placed orders. The Patient requires high complexity decision making for assessment and support.  Case was discussed on Rounds with the Respiratory Therapy Staff  Yevonne Pax, MD [facility] Pulmonary Critical Care Medicine Sleep Medicine

## 2019-04-23 DIAGNOSIS — J181 Lobar pneumonia, unspecified organism: Secondary | ICD-10-CM | POA: Diagnosis not present

## 2019-04-23 DIAGNOSIS — J449 Chronic obstructive pulmonary disease, unspecified: Secondary | ICD-10-CM | POA: Diagnosis not present

## 2019-04-23 DIAGNOSIS — J9621 Acute and chronic respiratory failure with hypoxia: Secondary | ICD-10-CM | POA: Diagnosis not present

## 2019-04-23 DIAGNOSIS — I469 Cardiac arrest, cause unspecified: Secondary | ICD-10-CM | POA: Diagnosis not present

## 2019-04-23 LAB — BASIC METABOLIC PANEL
Anion gap: 6 (ref 5–15)
BUN: 30 mg/dL — ABNORMAL HIGH (ref 8–23)
CO2: 37 mmol/L — ABNORMAL HIGH (ref 22–32)
Calcium: 8.6 mg/dL — ABNORMAL LOW (ref 8.9–10.3)
Chloride: 103 mmol/L (ref 98–111)
Creatinine, Ser: 0.87 mg/dL (ref 0.61–1.24)
GFR calc Af Amer: >60 mL/min (ref >60)
GFR calc non Af Amer: >60 mL/min (ref >60)
Glucose, Bld: 158 mg/dL — ABNORMAL HIGH (ref 70–99)
Potassium: 3.6 mmol/L (ref 3.5–5.1)
Sodium: 146 mmol/L — ABNORMAL HIGH (ref 135–145)

## 2019-04-23 LAB — CBC
HCT: 38 % — ABNORMAL LOW (ref 39.0–52.0)
Hemoglobin: 11.3 g/dL — ABNORMAL LOW (ref 13.0–17.0)
MCH: 28.8 pg (ref 26.0–34.0)
MCHC: 29.7 g/dL — ABNORMAL LOW (ref 30.0–36.0)
MCV: 96.9 fL (ref 80.0–100.0)
Platelets: 249 10*3/uL (ref 150–400)
RBC: 3.92 MIL/uL — ABNORMAL LOW (ref 4.22–5.81)
RDW: 18.8 % — ABNORMAL HIGH (ref 11.5–15.5)
WBC: 11.1 10*3/uL — ABNORMAL HIGH (ref 4.0–10.5)
nRBC: 0 % (ref 0.0–0.2)

## 2019-04-23 LAB — URINE CULTURE
Culture: NO GROWTH
Special Requests: NORMAL

## 2019-04-23 LAB — AMMONIA: Ammonia: 49 umol/L — ABNORMAL HIGH (ref 9–35)

## 2019-04-23 NOTE — Progress Notes (Addendum)
Pulmonary Critical Care Medicine [facility] [place]   PULMONARY CRITICAL CARE SERVICE  PROGRESS NOTE  Date of Service: 04/23/2019  Donald Crane  NOM:[id number]  DOB: Oct 12, 1956   DOA: 04/16/2019  Referring Physician: Carron Curie, MD  HPI: Donald Crane is a 63 y.o. male seen for follow up of Acute on Chronic Respiratory Failure.  Patient currently on 6 L of oxygen via Oxymizer satting in the low 90s.  No distress noted at this time.  Medications: Reviewed on Rounds  Physical Exam:  Vitals: Pulse 92 respirations 20 BP 124/68 O2 sat 95% temp 99.2  Ventilator Settings 6 L Oxymizer  . General: Comfortable at this time . Eyes: Grossly normal lids, irises & conjunctiva . ENT: grossly tongue is normal . Neck: no obvious mass . Cardiovascular: S1 S2 normal no gallop . Respiratory: No rales or rhonchi noted . Abdomen: soft . Skin: no rash seen on limited exam . Musculoskeletal: not rigid . Psychiatric:unable to assess . Neurologic: no seizure no involuntary movements         Lab Data:   Basic Metabolic Panel: Recent Labs  Lab 04/17/19 0500 04/20/19 0523 04/21/19 0659 04/23/19 0602  NA 156* 144  --  146*  K 3.7 3.0* 4.5 3.6  CL 111 97*  --  103  CO2 34* 38*  --  37*  GLUCOSE 114* 92  --  158*  BUN 28* 21  --  30*  CREATININE 1.11 0.84  --  0.87  CALCIUM 8.7* 8.3*  --  8.6*  MG 2.2  --   --   --     ABG: Recent Labs  Lab 04/22/19 1540  PHART 7.443  PCO2ART 65.3*  PO2ART 73.4*  HCO3 43.9*  O2SAT 94.5    Liver Function Tests: No results for input(s): AST, ALT, ALKPHOS, BILITOT, PROT, ALBUMIN in the last 168 hours. No results for input(s): LIPASE, AMYLASE in the last 168 hours. Recent Labs  Lab 04/23/19 0602  AMMONIA 49*    CBC: Recent Labs  Lab 04/17/19 0500 04/23/19 0602  WBC 19.3* 11.1*  HGB 13.7 11.3*  HCT 45.7 38.0*  MCV 98.1 96.9  PLT 264 249    Cardiac Enzymes: No results for input(s): CKTOTAL, CKMB, CKMBINDEX, TROPONINI in the  last 168 hours.  BNP (last 3 results) No results for input(s): BNP in the last 8760 hours.  ProBNP (last 3 results) No results for input(s): PROBNP in the last 8760 hours.  Radiological Exams: No results found.  Assessment/Plan Active Problems:   Acute on chronic respiratory failure with hypoxia (HCC)   Cardiac arrest (HCC)   Severe sepsis (HCC)   Community acquired pneumonia of both lower lobes (HCC)   COPD, severe (HCC)   1. Acute on chronic respiratory failure with hypoxia continue with ventilator support at this time.  May use BiPAP as tolerated.  Continue supportive measures 2. Cardiac arrest rhythm stable 3. Sepsis with gram-negative bacteria continue supportive care 4. Severe COPD at baseline continue present management 5. Community-acquired pneumonia continue to follow-up   I have personally seen and evaluated the patient, evaluated laboratory and imaging results, formulated the assessment and plan and placed orders. The Patient requires high complexity decision making for assessment and support.  Case was discussed on Rounds with the Respiratory Therapy Staff  Yevonne Pax, MD [facility] Pulmonary Critical Care Medicine Sleep Medicine

## 2019-04-24 ENCOUNTER — Other Ambulatory Visit (HOSPITAL_COMMUNITY): Payer: Self-pay

## 2019-04-24 DIAGNOSIS — I469 Cardiac arrest, cause unspecified: Secondary | ICD-10-CM | POA: Diagnosis not present

## 2019-04-24 DIAGNOSIS — J9621 Acute and chronic respiratory failure with hypoxia: Secondary | ICD-10-CM | POA: Diagnosis not present

## 2019-04-24 DIAGNOSIS — J181 Lobar pneumonia, unspecified organism: Secondary | ICD-10-CM | POA: Diagnosis not present

## 2019-04-24 DIAGNOSIS — J449 Chronic obstructive pulmonary disease, unspecified: Secondary | ICD-10-CM | POA: Diagnosis not present

## 2019-04-24 LAB — CBC
HCT: 39.2 % (ref 39.0–52.0)
Hemoglobin: 11.7 g/dL — ABNORMAL LOW (ref 13.0–17.0)
MCH: 28.7 pg (ref 26.0–34.0)
MCHC: 29.8 g/dL — ABNORMAL LOW (ref 30.0–36.0)
MCV: 96.3 fL (ref 80.0–100.0)
Platelets: 232 10*3/uL (ref 150–400)
RBC: 4.07 MIL/uL — ABNORMAL LOW (ref 4.22–5.81)
RDW: 19.3 % — ABNORMAL HIGH (ref 11.5–15.5)
WBC: 18.5 10*3/uL — ABNORMAL HIGH (ref 4.0–10.5)
nRBC: 0 % (ref 0.0–0.2)

## 2019-04-24 LAB — URINALYSIS, ROUTINE W REFLEX MICROSCOPIC
Bilirubin Urine: NEGATIVE
Glucose, UA: NEGATIVE mg/dL
Ketones, ur: NEGATIVE mg/dL
Leukocytes,Ua: NEGATIVE
Nitrite: NEGATIVE
Protein, ur: 30 mg/dL — AB
Specific Gravity, Urine: 1.026 (ref 1.005–1.030)
pH: 6 (ref 5.0–8.0)

## 2019-04-24 LAB — VANCOMYCIN, TROUGH: Vancomycin Tr: 10 ug/mL — ABNORMAL LOW (ref 15–20)

## 2019-04-24 NOTE — Progress Notes (Addendum)
Pulmonary Critical Care Medicine [facility] [place]   PULMONARY CRITICAL CARE SERVICE  PROGRESS NOTE  Date of Service: 04/24/2019  Donald Crane  HQP:[id number]  DOB: Dec 01, 1956   DOA: 04/16/2019  Referring Physician: Carron Curie, MD  HPI: Donald Crane is a 63 y.o. male seen for follow up of Acute on Chronic Respiratory Failure.  Patient is currently on 7 L of oxygen via Oxymizer and satting well.  Patient had to go on BiPAP last night due to increased work of breathing however was weaned back to Oxymizer this morning with no difficulty.  Medications: Reviewed on Rounds  Physical Exam:  Vitals: Pulse 95 respirations 24 BP 128/73 O2 sat 97% temp 100.2  Ventilator Settings 7 L Oxymizer  . General: Comfortable at this time . Eyes: Grossly normal lids, irises & conjunctiva . ENT: grossly tongue is normal . Neck: no obvious mass . Cardiovascular: S1 S2 normal no gallop . Respiratory: No rales or rhonchi noted . Abdomen: soft . Skin: no rash seen on limited exam . Musculoskeletal: not rigid . Psychiatric:unable to assess . Neurologic: no seizure no involuntary movements         Lab Data:   Basic Metabolic Panel: Recent Labs  Lab 04/20/19 0523 04/21/19 0659 04/23/19 0602  NA 144  --  146*  K 3.0* 4.5 3.6  CL 97*  --  103  CO2 38*  --  37*  GLUCOSE 92  --  158*  BUN 21  --  30*  CREATININE 0.84  --  0.87  CALCIUM 8.3*  --  8.6*    ABG: Recent Labs  Lab 04/22/19 1540  PHART 7.443  PCO2ART 65.3*  PO2ART 73.4*  HCO3 43.9*  O2SAT 94.5    Liver Function Tests: No results for input(s): AST, ALT, ALKPHOS, BILITOT, PROT, ALBUMIN in the last 168 hours. No results for input(s): LIPASE, AMYLASE in the last 168 hours. Recent Labs  Lab 04/23/19 0602  AMMONIA 49*    CBC: Recent Labs  Lab 04/23/19 0602  WBC 11.1*  HGB 11.3*  HCT 38.0*  MCV 96.9  PLT 249    Cardiac Enzymes: No results for input(s): CKTOTAL, CKMB, CKMBINDEX, TROPONINI in the last  168 hours.  BNP (last 3 results) No results for input(s): BNP in the last 8760 hours.  ProBNP (last 3 results) No results for input(s): PROBNP in the last 8760 hours.  Radiological Exams: No results found.  Assessment/Plan Active Problems:   Acute on chronic respiratory failure with hypoxia (HCC)   Cardiac arrest (HCC)   Severe sepsis (HCC)   Community acquired pneumonia of both lower lobes (HCC)   COPD, severe (HCC)   1. Acute on chronic respiratory failure with hypoxia continue with current oxygen via Oxymizer and using BiPAP as needed.  Continue supportive measures and pulmonary toilet 2. Cardiac arrest rhythm stable 3. Sepsis with gram-negative bacteria continue supportive care 4. Severe COPD at baseline continue present management 5. Community-acquired pneumonia continue to follow   I have personally seen and evaluated the patient, evaluated laboratory and imaging results, formulated the assessment and plan and placed orders. The Patient requires high complexity decision making for assessment and support.  Case was discussed on Rounds with the Respiratory Therapy Staff  Yevonne Pax, MD [facility] Pulmonary Critical Care Medicine Sleep Medicine

## 2019-04-25 DIAGNOSIS — J449 Chronic obstructive pulmonary disease, unspecified: Secondary | ICD-10-CM | POA: Diagnosis not present

## 2019-04-25 DIAGNOSIS — J9621 Acute and chronic respiratory failure with hypoxia: Secondary | ICD-10-CM | POA: Diagnosis not present

## 2019-04-25 DIAGNOSIS — J181 Lobar pneumonia, unspecified organism: Secondary | ICD-10-CM | POA: Diagnosis not present

## 2019-04-25 DIAGNOSIS — I469 Cardiac arrest, cause unspecified: Secondary | ICD-10-CM | POA: Diagnosis not present

## 2019-04-25 LAB — CBC
HCT: 35.4 % — ABNORMAL LOW (ref 39.0–52.0)
Hemoglobin: 10.8 g/dL — ABNORMAL LOW (ref 13.0–17.0)
MCH: 29.6 pg (ref 26.0–34.0)
MCHC: 30.5 g/dL (ref 30.0–36.0)
MCV: 97 fL (ref 80.0–100.0)
Platelets: 229 10*3/uL (ref 150–400)
RBC: 3.65 MIL/uL — ABNORMAL LOW (ref 4.22–5.81)
RDW: 19.3 % — ABNORMAL HIGH (ref 11.5–15.5)
WBC: 13.6 10*3/uL — ABNORMAL HIGH (ref 4.0–10.5)
nRBC: 0.1 % (ref 0.0–0.2)

## 2019-04-25 LAB — URINE CULTURE: Culture: NO GROWTH

## 2019-04-25 NOTE — Progress Notes (Addendum)
Pulmonary Critical Care Medicine [facility] [place]   PULMONARY CRITICAL CARE SERVICE  PROGRESS NOTE  Date of Service: 04/25/2019  Donald Crane  YHC:[id number]  DOB: 1956-05-24   DOA: 04/16/2019  Referring Physician: Carron Curie, MD  HPI: Donald Crane is a 63 y.o. male seen for follow up of Acute on Chronic Respiratory Failure.  Currently on 7 L of oxygen via Oxymizer.  Satting in the mid 90s doing well at this time with no distress.  Medications: Reviewed on Rounds  Physical Exam:  Vitals: Pulse 100 respirations 23 BP 143/75 O2 sat 95% temp 99.1  Ventilator Settings 7 L Oxymizer  . General: Comfortable at this time . Eyes: Grossly normal lids, irises & conjunctiva . ENT: grossly tongue is normal . Neck: no obvious mass . Cardiovascular: S1 S2 normal no gallop . Respiratory: No rales or rhonchi noted . Abdomen: soft . Skin: no rash seen on limited exam . Musculoskeletal: not rigid . Psychiatric:unable to assess . Neurologic: no seizure no involuntary movements         Lab Data:   Basic Metabolic Panel: Recent Labs  Lab 04/20/19 0523 04/21/19 0659 04/23/19 0602  NA 144  --  146*  K 3.0* 4.5 3.6  CL 97*  --  103  CO2 38*  --  37*  GLUCOSE 92  --  158*  BUN 21  --  30*  CREATININE 0.84  --  0.87  CALCIUM 8.3*  --  8.6*    ABG: Recent Labs  Lab 04/22/19 1540  PHART 7.443  PCO2ART 65.3*  PO2ART 73.4*  HCO3 43.9*  O2SAT 94.5    Liver Function Tests: No results for input(s): AST, ALT, ALKPHOS, BILITOT, PROT, ALBUMIN in the last 168 hours. No results for input(s): LIPASE, AMYLASE in the last 168 hours. Recent Labs  Lab 04/23/19 0602  AMMONIA 49*    CBC: Recent Labs  Lab 04/23/19 0602 04/24/19 1225 04/25/19 0328  WBC 11.1* 18.5* 13.6*  HGB 11.3* 11.7* 10.8*  HCT 38.0* 39.2 35.4*  MCV 96.9 96.3 97.0  PLT 249 232 229    Cardiac Enzymes: No results for input(s): CKTOTAL, CKMB, CKMBINDEX, TROPONINI in the last 168 hours.  BNP (last  3 results) No results for input(s): BNP in the last 8760 hours.  ProBNP (last 3 results) No results for input(s): PROBNP in the last 8760 hours.  Radiological Exams: Dg Chest Port 1 View  Result Date: 04/24/2019 CLINICAL DATA:  63 year old male with respiratory failure EXAM: PORTABLE CHEST 1 VIEW COMPARISON:  04/16/2019 FINDINGS: Cardiomediastinal silhouette unchanged. Right rotation of the patient limits evaluation. Lung volumes are similar to the prior with persistently low lung volumes. Patchy opacity at the left lung base. Gastric tube traverses the mediastinum terminating out of the field of view. No pneumothorax.  No large pleural effusion. IMPRESSION: Similar appearance of the chest x-ray with low lung volumes and patchy left greater than right airspace opacities, potentially atelectasis/consolidation. Gastric tube terminates out of the field of view Electronically Signed   By: Gilmer Mor D.O.   On: 04/24/2019 13:43    Assessment/Plan Active Problems:   Acute on chronic respiratory failure with hypoxia (HCC)   Cardiac arrest (HCC)   Severe sepsis (HCC)   Community acquired pneumonia of both lower lobes (HCC)   COPD, severe (HCC)   1. Acute on chronic respiratory failure with hypoxia continue with oxygen via Oxymizer and use BiPAP as needed.  Continue supportive measures and pulmonary toilet 2. Cardiac arrest  rhythm is stable 3. Sepsis with gram-negative bacteria continue supportive care 4. Severe COPD at baseline continue present management 5. Community-acquired pneumonia continue to follow   I have personally seen and evaluated the patient, evaluated laboratory and imaging results, formulated the assessment and plan and placed orders. The Patient requires high complexity decision making for assessment and support.  Case was discussed on Rounds with the Respiratory Therapy Staff  Yevonne PaxSaadat A Khan, MD [facility] Pulmonary Critical Care Medicine Sleep Medicine

## 2019-04-26 DIAGNOSIS — J449 Chronic obstructive pulmonary disease, unspecified: Secondary | ICD-10-CM | POA: Diagnosis not present

## 2019-04-26 DIAGNOSIS — J181 Lobar pneumonia, unspecified organism: Secondary | ICD-10-CM | POA: Diagnosis not present

## 2019-04-26 DIAGNOSIS — I469 Cardiac arrest, cause unspecified: Secondary | ICD-10-CM | POA: Diagnosis not present

## 2019-04-26 DIAGNOSIS — J9621 Acute and chronic respiratory failure with hypoxia: Secondary | ICD-10-CM | POA: Diagnosis not present

## 2019-04-26 LAB — VANCOMYCIN, TROUGH
Vancomycin Tr: 10 ug/mL — ABNORMAL LOW (ref 15–20)
Vancomycin Tr: 26 ug/mL (ref 15–20)

## 2019-04-26 LAB — CREATININE, SERUM
Creatinine, Ser: 0.87 mg/dL (ref 0.61–1.24)
GFR calc Af Amer: >60 mL/min (ref >60)
GFR calc non Af Amer: >60 mL/min (ref >60)

## 2019-04-26 NOTE — Progress Notes (Addendum)
Pulmonary Critical Care Medicine [facility] [place]   PULMONARY CRITICAL CARE SERVICE  PROGRESS NOTE  Date of Service: 04/26/2019  Donald Crane  FHL:[id number]  DOB: 05/02/1956   DOA: 04/16/2019  Referring Physician: Carron Curie, MD  HPI: Donald Crane is a 63 y.o. male seen for follow up of Acute on Chronic Respiratory Failure.  Patient currently on heated high flow nasal cannula 25 L and 60% resting comfortably with no distress at this time.  Medications: Reviewed on Rounds  Physical Exam:  Vitals: Pulse 95 respirations 20 BP 144/80 O2 sat 96% temp 98.7  Ventilator Settings heated high flow nasal cannula 25 L 60% FiO2  . General: Comfortable at this time . Eyes: Grossly normal lids, irises & conjunctiva . ENT: grossly tongue is normal . Neck: no obvious mass . Cardiovascular: S1 S2 normal no gallop . Respiratory: No rales or rhonchi noted . Abdomen: soft . Skin: no rash seen on limited exam . Musculoskeletal: not rigid . Psychiatric:unable to assess . Neurologic: no seizure no involuntary movements         Lab Data:   Basic Metabolic Panel: Recent Labs  Lab 04/20/19 0523 04/21/19 0659 04/23/19 0602 04/26/19 0117  NA 144  --  146*  --   K 3.0* 4.5 3.6  --   CL 97*  --  103  --   CO2 38*  --  37*  --   GLUCOSE 92  --  158*  --   BUN 21  --  30*  --   CREATININE 0.84  --  0.87 0.87  CALCIUM 8.3*  --  8.6*  --     ABG: Recent Labs  Lab 04/22/19 1540  PHART 7.443  PCO2ART 65.3*  PO2ART 73.4*  HCO3 43.9*  O2SAT 94.5    Liver Function Tests: No results for input(s): AST, ALT, ALKPHOS, BILITOT, PROT, ALBUMIN in the last 168 hours. No results for input(s): LIPASE, AMYLASE in the last 168 hours. Recent Labs  Lab 04/23/19 0602  AMMONIA 49*    CBC: Recent Labs  Lab 04/23/19 0602 04/24/19 1225 04/25/19 0328  WBC 11.1* 18.5* 13.6*  HGB 11.3* 11.7* 10.8*  HCT 38.0* 39.2 35.4*  MCV 96.9 96.3 97.0  PLT 249 232 229    Cardiac  Enzymes: No results for input(s): CKTOTAL, CKMB, CKMBINDEX, TROPONINI in the last 168 hours.  BNP (last 3 results) No results for input(s): BNP in the last 8760 hours.  ProBNP (last 3 results) No results for input(s): PROBNP in the last 8760 hours.  Radiological Exams: No results found.  Assessment/Plan Active Problems:   Acute on chronic respiratory failure with hypoxia (HCC)   Cardiac arrest (HCC)   Severe sepsis (HCC)   Community acquired pneumonia of both lower lobes (HCC)   COPD, severe (HCC)   1. Acute on chronic respiratory failure with hypoxia continue with oxygen via heated high flow nasal cannula 25 L and 60% currently continue to wean as tolerated.  Continue supportive measures and pulmonary toilet as well as secretion management 2. Cardiac arrest rhythm stable 3. Sepsis with gram-negative bacteria continue supportive care 4. Severe COPD at baseline continue present management 5. Community-acquired pneumonia continue to follow   I have personally seen and evaluated the patient, evaluated laboratory and imaging results, formulated the assessment and plan and placed orders. The Patient requires high complexity decision making for assessment and support.  Case was discussed on Rounds with the Respiratory Therapy Staff  Yevonne Pax, MD [facility]  Pulmonary Critical Care Medicine Sleep Medicine

## 2019-04-27 DIAGNOSIS — J9621 Acute and chronic respiratory failure with hypoxia: Secondary | ICD-10-CM | POA: Diagnosis not present

## 2019-04-27 DIAGNOSIS — J181 Lobar pneumonia, unspecified organism: Secondary | ICD-10-CM | POA: Diagnosis not present

## 2019-04-27 DIAGNOSIS — I469 Cardiac arrest, cause unspecified: Secondary | ICD-10-CM | POA: Diagnosis not present

## 2019-04-27 DIAGNOSIS — J449 Chronic obstructive pulmonary disease, unspecified: Secondary | ICD-10-CM | POA: Diagnosis not present

## 2019-04-27 LAB — CULTURE, RESPIRATORY W GRAM STAIN

## 2019-04-27 LAB — CULTURE, RESPIRATORY

## 2019-04-27 LAB — VANCOMYCIN, TROUGH: Vancomycin Tr: 17 ug/mL (ref 15–20)

## 2019-04-27 NOTE — Progress Notes (Addendum)
Pulmonary Critical Care Medicine [facility] [place]   PULMONARY CRITICAL CARE SERVICE  PROGRESS NOTE  Date of Service: 04/27/2019  Donald Crane  QBV:[id number]  DOB: [birth year]   DOA: 04/16/2019  Referring Physician: Carron Curie, MD  HPI: Donald Crane is a 63 y.o. male seen for follow up of Acute on Chronic Respiratory Failure.  Patient currently on high flow via trach collar 35 L and 60% FiO2.  Resting comfortably this time.  Medications: Reviewed on Rounds  Physical Exam:  Vitals: Pulse 109 respirations 19 BP 136/80 O2 sat 94% temp 99.9  Ventilator Settings heated high flow at 35 L 60% FiO2  . General: Comfortable at this time . Eyes: Grossly normal lids, irises & conjunctiva . ENT: grossly tongue is normal . Neck: no obvious mass . Cardiovascular: S1 S2 normal no gallop . Respiratory: No rales or rhonchi noted . Abdomen: soft . Skin: no rash seen on limited exam . Musculoskeletal: not rigid . Psychiatric:unable to assess . Neurologic: no seizure no involuntary movements         Lab Data:   Basic Metabolic Panel: Recent Labs  Lab 04/21/19 0659 04/23/19 0602 04/26/19 0117  NA  --  146*  --   K 4.5 3.6  --   CL  --  103  --   CO2  --  37*  --   GLUCOSE  --  158*  --   BUN  --  30*  --   CREATININE  --  0.87 0.87  CALCIUM  --  8.6*  --     ABG: Recent Labs  Lab 04/22/19 1540  PHART 7.443  PCO2ART 65.3*  PO2ART 73.4*  HCO3 43.9*  O2SAT 94.5    Liver Function Tests: No results for input(s): AST, ALT, ALKPHOS, BILITOT, PROT, ALBUMIN in the last 168 hours. No results for input(s): LIPASE, AMYLASE in the last 168 hours. Recent Labs  Lab 04/23/19 0602  AMMONIA 49*    CBC: Recent Labs  Lab 04/23/19 0602 04/24/19 1225 04/25/19 0328  WBC 11.1* 18.5* 13.6*  HGB 11.3* 11.7* 10.8*  HCT 38.0* 39.2 35.4*  MCV 96.9 96.3 97.0  PLT 249 232 229    Cardiac Enzymes: No results for input(s): CKTOTAL, CKMB, CKMBINDEX, TROPONINI in the last  168 hours.  BNP (last 3 results) No results for input(s): BNP in the last 8760 hours.  ProBNP (last 3 results) No results for input(s): PROBNP in the last 8760 hours.  Radiological Exams: No results found.  Assessment/Plan Active Problems:   Acute on chronic respiratory failure with hypoxia (HCC)   Cardiac arrest (HCC)   Severe sepsis (HCC)   Community acquired pneumonia of both lower lobes (HCC)   COPD, severe (HCC)   1. Acute on chronic respiratory failure with hypoxia patient seen for 35 L and 60% FiO2 today.  Continue to wean as tolerated.  Continue pulmonary toilet and supportive measures. 2. Cardiac arrest rhythm stable 3. Sepsis with gram-negative bacteria continue supportive care 4. Severe COPD 5. Continue present management 6. Community-acquired pneumonia continue to follow   I have personally seen and evaluated the patient, evaluated laboratory and imaging results, formulated the assessment and plan and placed orders. The Patient requires high complexity decision making for assessment and support.  Case was discussed on Rounds with the Respiratory Therapy Staff  Yevonne Pax, MD [facility] Pulmonary Critical Care Medicine Sleep Medicine

## 2019-04-28 DIAGNOSIS — J181 Lobar pneumonia, unspecified organism: Secondary | ICD-10-CM | POA: Diagnosis not present

## 2019-04-28 DIAGNOSIS — J9621 Acute and chronic respiratory failure with hypoxia: Secondary | ICD-10-CM | POA: Diagnosis not present

## 2019-04-28 DIAGNOSIS — I469 Cardiac arrest, cause unspecified: Secondary | ICD-10-CM | POA: Diagnosis not present

## 2019-04-28 DIAGNOSIS — J449 Chronic obstructive pulmonary disease, unspecified: Secondary | ICD-10-CM | POA: Diagnosis not present

## 2019-04-28 NOTE — Progress Notes (Addendum)
Pulmonary Critical Care Medicine [facility] [place]   PULMONARY CRITICAL CARE SERVICE  PROGRESS NOTE  Date of Service: 04/28/2019  Donald Crane  [id number]  DOB: [birth year]   DOA: 04/16/2019  Referring Physician: Carron Curie, MD  HPI: Donald Crane is a 63 y.o. male seen for follow up of Acute on Chronic Respiratory Failure.  Patient remains on 7 L of oxygen via Oxymizer at this time.  Satting well with no distress.  Medications: Reviewed on Rounds  Physical Exam:  Vitals: Pulse 84 respirations 20 BP 126/64 O2 sat 92% temp 98.7  Ventilator Settings 7 L via Oxymizer  . General: Comfortable at this time . Eyes: Grossly normal lids, irises & conjunctiva . ENT: grossly tongue is normal . Neck: no obvious mass . Cardiovascular: S1 S2 normal no gallop . Respiratory: No rales or rhonchi noted . Abdomen: soft . Skin: no rash seen on limited exam . Musculoskeletal: not rigid . Psychiatric:unable to assess . Neurologic: no seizure no involuntary movements         Lab Data:   Basic Metabolic Panel: Recent Labs  Lab 04/23/19 0602 04/26/19 0117  NA 146*  --   K 3.6  --   CL 103  --   CO2 37*  --   GLUCOSE 158*  --   BUN 30*  --   CREATININE 0.87 0.87  CALCIUM 8.6*  --     ABG: Recent Labs  Lab 04/22/19 1540  PHART 7.443  PCO2ART 65.3*  PO2ART 73.4*  HCO3 43.9*  O2SAT 94.5    Liver Function Tests: No results for input(s): AST, ALT, ALKPHOS, BILITOT, PROT, ALBUMIN in the last 168 hours. No results for input(s): LIPASE, AMYLASE in the last 168 hours. Recent Labs  Lab 04/23/19 0602  AMMONIA 49*    CBC: Recent Labs  Lab 04/23/19 0602 04/24/19 1225 04/25/19 0328  WBC 11.1* 18.5* 13.6*  HGB 11.3* 11.7* 10.8*  HCT 38.0* 39.2 35.4*  MCV 96.9 96.3 97.0  PLT 249 232 229    Cardiac Enzymes: No results for input(s): CKTOTAL, CKMB, CKMBINDEX, TROPONINI in the last 168 hours.  BNP (last 3 results) No results for input(s): BNP in the last 8760  hours.  ProBNP (last 3 results) No results for input(s): PROBNP in the last 8760 hours.  Radiological Exams: No results found.  Assessment/Plan Active Problems:   Acute on chronic respiratory failure with hypoxia (HCC)   Cardiac arrest (HCC)   Severe sepsis (HCC)   Community acquired pneumonia of both lower lobes (HCC)   COPD, severe (HCC)   1. Acute on chronic respiratory failure with hypoxia patient currently on 7 L per Oxymizer satting well with no distress.  Continue pulmonary toilet secretion management 2. Cardiac arrest rhythm stable 3. Sepsis with gram-negative bacteria continue supportive care 4. Severe COPD continue present management 5. Community-acquired pneumonia continue to follow   I have personally seen and evaluated the patient, evaluated laboratory and imaging results, formulated the assessment and plan and placed orders. The Patient requires high complexity decision making for assessment and support.  Case was discussed on Rounds with the Respiratory Therapy Staff  Yevonne Pax, MD [facility] Pulmonary Critical Care Medicine Sleep Medicine

## 2019-04-29 DIAGNOSIS — I469 Cardiac arrest, cause unspecified: Secondary | ICD-10-CM | POA: Diagnosis not present

## 2019-04-29 DIAGNOSIS — J9621 Acute and chronic respiratory failure with hypoxia: Secondary | ICD-10-CM | POA: Diagnosis not present

## 2019-04-29 DIAGNOSIS — J449 Chronic obstructive pulmonary disease, unspecified: Secondary | ICD-10-CM | POA: Diagnosis not present

## 2019-04-29 DIAGNOSIS — J181 Lobar pneumonia, unspecified organism: Secondary | ICD-10-CM | POA: Diagnosis not present

## 2019-04-29 LAB — BASIC METABOLIC PANEL
Anion gap: 8 (ref 5–15)
BUN: 30 mg/dL — ABNORMAL HIGH (ref 8–23)
CO2: 30 mmol/L (ref 22–32)
Calcium: 8.5 mg/dL — ABNORMAL LOW (ref 8.9–10.3)
Chloride: 115 mmol/L — ABNORMAL HIGH (ref 98–111)
Creatinine, Ser: 0.94 mg/dL (ref 0.61–1.24)
GFR calc Af Amer: >60 mL/min (ref >60)
GFR calc non Af Amer: >60 mL/min (ref >60)
Glucose, Bld: 111 mg/dL — ABNORMAL HIGH (ref 70–99)
Potassium: 5.2 mmol/L — ABNORMAL HIGH (ref 3.5–5.1)
Sodium: 153 mmol/L — ABNORMAL HIGH (ref 135–145)

## 2019-04-29 LAB — CULTURE, BLOOD (ROUTINE X 2)
Culture: NO GROWTH
Culture: NO GROWTH
Special Requests: ADEQUATE
Special Requests: ADEQUATE

## 2019-04-29 LAB — CBC
HCT: 39.3 % (ref 39.0–52.0)
Hemoglobin: 11.3 g/dL — ABNORMAL LOW (ref 13.0–17.0)
MCH: 30.1 pg (ref 26.0–34.0)
MCHC: 28.8 g/dL — ABNORMAL LOW (ref 30.0–36.0)
MCV: 104.8 fL — ABNORMAL HIGH (ref 80.0–100.0)
Platelets: 275 10*3/uL (ref 150–400)
RBC: 3.75 MIL/uL — ABNORMAL LOW (ref 4.22–5.81)
RDW: 18.4 % — ABNORMAL HIGH (ref 11.5–15.5)
WBC: 10.3 10*3/uL (ref 4.0–10.5)
nRBC: 0.3 % — ABNORMAL HIGH (ref 0.0–0.2)

## 2019-04-29 LAB — AMMONIA: Ammonia: 37 umol/L — ABNORMAL HIGH (ref 9–35)

## 2019-04-29 NOTE — Progress Notes (Addendum)
Pulmonary Critical Care Medicine [facility] [place]   PULMONARY CRITICAL CARE SERVICE  PROGRESS NOTE  Date of Service: 04/29/2019  Donald Crane  XTA:[id number]  DOB: [birth year]   DOA: 04/16/2019  Referring Physician: Carron Curie, MD  HPI: Donald Crane is a 63 y.o. male seen for follow up of Acute on Chronic Respiratory Failure.  Patient currently on 6 L of oxygen via Oxymizer.  Resting comfortably at this time.  No distress is noted.  Medications: Reviewed on Rounds  Physical Exam:  Vitals: Pulse 93 respirations 18 BP 116/65 O2 sat 95% temp 99.5  Ventilator Settings 6 L Oxymizer  . General: Comfortable at this time . Eyes: Grossly normal lids, irises & conjunctiva . ENT: grossly tongue is normal . Neck: no obvious mass . Cardiovascular: S1 S2 normal no gallop . Respiratory: No rales or rhonchi noted . Abdomen: soft . Skin: no rash seen on limited exam . Musculoskeletal: not rigid . Psychiatric:unable to assess . Neurologic: no seizure no involuntary movements         Lab Data:   Basic Metabolic Panel: Recent Labs  Lab 04/23/19 0602 04/26/19 0117 04/29/19 0650  NA 146*  --  153*  K 3.6  --  5.2*  CL 103  --  115*  CO2 37*  --  30  GLUCOSE 158*  --  111*  BUN 30*  --  30*  CREATININE 0.87 0.87 0.94  CALCIUM 8.6*  --  8.5*    ABG: No results for input(s): PHART, PCO2ART, PO2ART, HCO3, O2SAT in the last 168 hours.  Liver Function Tests: No results for input(s): AST, ALT, ALKPHOS, BILITOT, PROT, ALBUMIN in the last 168 hours. No results for input(s): LIPASE, AMYLASE in the last 168 hours. Recent Labs  Lab 04/23/19 0602 04/29/19 0650  AMMONIA 49* 37*    CBC: Recent Labs  Lab 04/23/19 0602 04/24/19 1225 04/25/19 0328 04/29/19 0650  WBC 11.1* 18.5* 13.6* 10.3  HGB 11.3* 11.7* 10.8* 11.3*  HCT 38.0* 39.2 35.4* 39.3  MCV 96.9 96.3 97.0 104.8*  PLT 249 232 229 275    Cardiac Enzymes: No results for input(s): CKTOTAL, CKMB, CKMBINDEX,  TROPONINI in the last 168 hours.  BNP (last 3 results) No results for input(s): BNP in the last 8760 hours.  ProBNP (last 3 results) No results for input(s): PROBNP in the last 8760 hours.  Radiological Exams: No results found.  Assessment/Plan Active Problems:   Acute on chronic respiratory failure with hypoxia (HCC)   Cardiac arrest (HCC)   Severe sepsis (HCC)   Community acquired pneumonia of both lower lobes (HCC)   COPD, severe (HCC)   1. Acute on chronic respiratory failure with hypoxia patient currently on 6 L per Oxymizer satting well with no distress at this time.  Continue secretion management and pulmonary toilet. 2. Cardiac arrest rhythm stable 3. Sepsis with gram-negative bacteria continue supportive care 4. Severe COPD continue present management 5. Community-acquired pneumonia continue to follow   I have personally seen and evaluated the patient, evaluated laboratory and imaging results, formulated the assessment and plan and placed orders. The Patient requires high complexity decision making for assessment and support.  Case was discussed on Rounds with the Respiratory Therapy Staff  Yevonne Pax, MD [facility] Pulmonary Critical Care Medicine Sleep Medicine

## 2019-04-30 DIAGNOSIS — J9621 Acute and chronic respiratory failure with hypoxia: Secondary | ICD-10-CM | POA: Diagnosis not present

## 2019-04-30 DIAGNOSIS — J181 Lobar pneumonia, unspecified organism: Secondary | ICD-10-CM | POA: Diagnosis not present

## 2019-04-30 DIAGNOSIS — I469 Cardiac arrest, cause unspecified: Secondary | ICD-10-CM | POA: Diagnosis not present

## 2019-04-30 DIAGNOSIS — J449 Chronic obstructive pulmonary disease, unspecified: Secondary | ICD-10-CM | POA: Diagnosis not present

## 2019-04-30 NOTE — Progress Notes (Addendum)
Pulmonary Critical Care Medicine [facility] [place]   PULMONARY CRITICAL CARE SERVICE  PROGRESS NOTE  Date of Service: 04/30/2019  Donald Crane  [id number]  DOB: [birth year]   DOA: 04/16/2019  Referring Physician: Carron Curie, MD  HPI: Donald Crane is a 63 y.o. male seen for follow up of Acute on Chronic Respiratory Failure.  Patient remains on 5 L of oxygen via Oxymizer at this time.  Satting well with no distress.  Medications: Reviewed on Rounds  Physical Exam:  Vitals: Pulse 89 respirations 24 BP 157/83 O2 sat 97% temp 97.4  Ventilator Settings 5 L Oxymizer  . General: Comfortable at this time . Eyes: Grossly normal lids, irises & conjunctiva . ENT: grossly tongue is normal . Neck: no obvious mass . Cardiovascular: S1 S2 normal no gallop . Respiratory: No rales or rhonchi noted . Abdomen: soft . Skin: no rash seen on limited exam . Musculoskeletal: not rigid . Psychiatric:unable to assess . Neurologic: no seizure no involuntary movements         Lab Data:   Basic Metabolic Panel: Recent Labs  Lab 04/26/19 0117 04/29/19 0650  NA  --  153*  K  --  5.2*  CL  --  115*  CO2  --  30  GLUCOSE  --  111*  BUN  --  30*  CREATININE 0.87 0.94  CALCIUM  --  8.5*    ABG: No results for input(s): PHART, PCO2ART, PO2ART, HCO3, O2SAT in the last 168 hours.  Liver Function Tests: No results for input(s): AST, ALT, ALKPHOS, BILITOT, PROT, ALBUMIN in the last 168 hours. No results for input(s): LIPASE, AMYLASE in the last 168 hours. Recent Labs  Lab 04/29/19 0650  AMMONIA 37*    CBC: Recent Labs  Lab 04/24/19 1225 04/25/19 0328 04/29/19 0650  WBC 18.5* 13.6* 10.3  HGB 11.7* 10.8* 11.3*  HCT 39.2 35.4* 39.3  MCV 96.3 97.0 104.8*  PLT 232 229 275    Cardiac Enzymes: No results for input(s): CKTOTAL, CKMB, CKMBINDEX, TROPONINI in the last 168 hours.  BNP (last 3 results) No results for input(s): BNP in the last 8760 hours.  ProBNP (last 3  results) No results for input(s): PROBNP in the last 8760 hours.  Radiological Exams: No results found.  Assessment/Plan Active Problems:   Acute on chronic respiratory failure with hypoxia (HCC)   Cardiac arrest (HCC)   Severe sepsis (HCC)   Community acquired pneumonia of both lower lobes (HCC)   COPD, severe (HCC)   1. Acute on chronic respiratory failure with hypoxia patient currently on 5 L of oxygen per Oxymizer.  Satting well with no distress.  Continue pulmonary toilet and secretion management 2. Cardiac arrest rhythm stable 3. Sepsis with gram-negative bacteria continue supportive care 4. Severe COPD continue present management 5. Community-acquired pneumonia continue to follow   I have personally seen and evaluated the patient, evaluated laboratory and imaging results, formulated the assessment and plan and placed orders. The Patient requires high complexity decision making for assessment and support.  Case was discussed on Rounds with the Respiratory Therapy Staff  Yevonne Pax, MD [facility] Pulmonary Critical Care Medicine Sleep Medicine

## 2019-05-01 DIAGNOSIS — J449 Chronic obstructive pulmonary disease, unspecified: Secondary | ICD-10-CM | POA: Diagnosis not present

## 2019-05-01 DIAGNOSIS — J9621 Acute and chronic respiratory failure with hypoxia: Secondary | ICD-10-CM | POA: Diagnosis not present

## 2019-05-01 DIAGNOSIS — J181 Lobar pneumonia, unspecified organism: Secondary | ICD-10-CM | POA: Diagnosis not present

## 2019-05-01 DIAGNOSIS — I469 Cardiac arrest, cause unspecified: Secondary | ICD-10-CM | POA: Diagnosis not present

## 2019-05-01 LAB — BASIC METABOLIC PANEL
Anion gap: 6 (ref 5–15)
BUN: 27 mg/dL — ABNORMAL HIGH (ref 8–23)
CO2: 35 mmol/L — ABNORMAL HIGH (ref 22–32)
Calcium: 8.7 mg/dL — ABNORMAL LOW (ref 8.9–10.3)
Chloride: 113 mmol/L — ABNORMAL HIGH (ref 98–111)
Creatinine, Ser: 0.69 mg/dL (ref 0.61–1.24)
GFR calc Af Amer: >60 mL/min (ref >60)
GFR calc non Af Amer: >60 mL/min (ref >60)
Glucose, Bld: 134 mg/dL — ABNORMAL HIGH (ref 70–99)
Potassium: 3.5 mmol/L (ref 3.5–5.1)
Sodium: 154 mmol/L — ABNORMAL HIGH (ref 135–145)

## 2019-05-01 NOTE — Progress Notes (Signed)
Pulmonary Critical Care Medicine [facility] [place]   PULMONARY CRITICAL CARE SERVICE  PROGRESS NOTE  Date of Service: 05/01/2019  Donald Crane  [id number]  DOB: Feb 05, 1956   DOA: 04/16/2019  Referring Physician: Carron Curie, MD  HPI: Donald Crane is a 63 y.o. male seen for follow up of Acute on Chronic Respiratory Failure.  Patient is on nasal cannula 5 L doing fairly well.  Apparently is going to be going to palliative care  Medications: Reviewed on Rounds  Physical Exam:  Vitals: Temperature 98.0 pulse 97 respiratory 24 blood pressure 155/74 saturation 95%  Ventilator Settings off the ventilator right now  . General: Comfortable at this time . Eyes: Grossly normal lids, irises & conjunctiva . ENT: grossly tongue is normal . Neck: no obvious mass . Cardiovascular: S1 S2 normal no gallop . Respiratory: Coarse breath sounds with rhonchi . Abdomen: soft . Skin: no rash seen on limited exam . Musculoskeletal: not rigid . Psychiatric:unable to assess . Neurologic: no seizure no involuntary movements         Lab Data:   Basic Metabolic Panel: Recent Labs  Lab 04/26/19 0117 04/29/19 0650 05/01/19 0732  NA  --  153* 154*  K  --  5.2* 3.5  CL  --  115* 113*  CO2  --  30 35*  GLUCOSE  --  111* 134*  BUN  --  30* 27*  CREATININE 0.87 0.94 0.69  CALCIUM  --  8.5* 8.7*    ABG: No results for input(s): PHART, PCO2ART, PO2ART, HCO3, O2SAT in the last 168 hours.  Liver Function Tests: No results for input(s): AST, ALT, ALKPHOS, BILITOT, PROT, ALBUMIN in the last 168 hours. No results for input(s): LIPASE, AMYLASE in the last 168 hours. Recent Labs  Lab 04/29/19 0650  AMMONIA 37*    CBC: Recent Labs  Lab 04/25/19 0328 04/29/19 0650  WBC 13.6* 10.3  HGB 10.8* 11.3*  HCT 35.4* 39.3  MCV 97.0 104.8*  PLT 229 275    Cardiac Enzymes: No results for input(s): CKTOTAL, CKMB, CKMBINDEX, TROPONINI in the last 168 hours.  BNP (last 3 results) No  results for input(s): BNP in the last 8760 hours.  ProBNP (last 3 results) No results for input(s): PROBNP in the last 8760 hours.  Radiological Exams: No results found.  Assessment/Plan Active Problems:   Acute on chronic respiratory failure with hypoxia (HCC)   Cardiac arrest (HCC)   Severe sepsis (HCC)   Community acquired pneumonia of both lower lobes (HCC)   COPD, severe (HCC)   1. Acute on chronic respiratory failure with hypoxia we will continue with oxygen therapy titrate down supportive care 2. Cardiac arrest rhythm stable 3. Severe COPD at baseline 4. Lobar pneumonia treated we will continue with supportive care 5. Severe sepsis resolved   I have personally seen and evaluated the patient, evaluated laboratory and imaging results, formulated the assessment and plan and placed orders. The Patient requires high complexity decision making for assessment and support.  Case was discussed on Rounds with the Respiratory Therapy Staff  Yevonne Pax, MD [facility] Pulmonary Critical Care Medicine Sleep Medicine

## 2019-05-02 DIAGNOSIS — J9621 Acute and chronic respiratory failure with hypoxia: Secondary | ICD-10-CM | POA: Diagnosis not present

## 2019-05-02 DIAGNOSIS — J449 Chronic obstructive pulmonary disease, unspecified: Secondary | ICD-10-CM | POA: Diagnosis not present

## 2019-05-02 DIAGNOSIS — J181 Lobar pneumonia, unspecified organism: Secondary | ICD-10-CM | POA: Diagnosis not present

## 2019-05-02 DIAGNOSIS — I469 Cardiac arrest, cause unspecified: Secondary | ICD-10-CM | POA: Diagnosis not present

## 2019-05-02 NOTE — Progress Notes (Addendum)
Pulmonary Critical Care Medicine [facility] [place]   PULMONARY CRITICAL CARE SERVICE  PROGRESS NOTE  Date of Service: 05/02/2019  Donald Crane  OZH:[id number]  DOB: 1956-07-12   DOA: 04/16/2019  Referring Physician: Carron Curie, MD  HPI: Donald Crane is a 63 y.o. male seen for follow up of Acute on Chronic Respiratory Failure.  Patient currently on 5 L of oxygen via nasal cannula.  Plan is to discharge home with hospice care today.  Currently satting well no distress.  Medications: Reviewed on Rounds  Physical Exam:  Vitals: Pulse 71 respirations 19 BP 114/50 O2 sat 94% temp 98.4  Ventilator Settings not currently on ventilator  . General: Comfortable at this time . Eyes: Grossly normal lids, irises & conjunctiva . ENT: grossly tongue is normal . Neck: no obvious mass . Cardiovascular: S1 S2 normal no gallop . Respiratory: Coarse breath sounds . Abdomen: soft . Skin: no rash seen on limited exam . Musculoskeletal: not rigid . Psychiatric:unable to assess . Neurologic: no seizure no involuntary movements         Lab Data:   Basic Metabolic Panel: Recent Labs  Lab 04/26/19 0117 04/29/19 0650 05/01/19 0732  NA  --  153* 154*  K  --  5.2* 3.5  CL  --  115* 113*  CO2  --  30 35*  GLUCOSE  --  111* 134*  BUN  --  30* 27*  CREATININE 0.87 0.94 0.69  CALCIUM  --  8.5* 8.7*    ABG: No results for input(s): PHART, PCO2ART, PO2ART, HCO3, O2SAT in the last 168 hours.  Liver Function Tests: No results for input(s): AST, ALT, ALKPHOS, BILITOT, PROT, ALBUMIN in the last 168 hours. No results for input(s): LIPASE, AMYLASE in the last 168 hours. Recent Labs  Lab 04/29/19 0650  AMMONIA 37*    CBC: Recent Labs  Lab 04/29/19 0650  WBC 10.3  HGB 11.3*  HCT 39.3  MCV 104.8*  PLT 275    Cardiac Enzymes: No results for input(s): CKTOTAL, CKMB, CKMBINDEX, TROPONINI in the last 168 hours.  BNP (last 3 results) No results for input(s): BNP in the last  8760 hours.  ProBNP (last 3 results) No results for input(s): PROBNP in the last 8760 hours.  Radiological Exams: No results found.  Assessment/Plan Active Problems:   Acute on chronic respiratory failure with hypoxia (HCC)   Cardiac arrest (HCC)   Severe sepsis (HCC)   Community acquired pneumonia of both lower lobes (HCC)   COPD, severe (HCC)   1. Acute on chronic respiratory failure hypoxia continue oxygen therapy.  Plan is for patient discharged home with hospice today. 2. Respiratory stable 3. Severe COPD at baseline 4. Lobar pneumonia treated continue supportive care 5. Severe sepsis resolved   I have personally seen and evaluated the patient, evaluated laboratory and imaging results, formulated the assessment and plan and placed orders. The Patient requires high complexity decision making for assessment and support.  Case was discussed on Rounds with the Respiratory Therapy Staff  Yevonne Pax, MD [facility] Pulmonary Critical Care Medicine Sleep Medicine

## 2019-05-19 DEATH — deceased

## 2020-08-08 IMAGING — DX PORTABLE CHEST - 1 VIEW
1 series · 2 of 2 positions shown · non-contrast
Comparison: None

CLINICAL DATA: Shortness of breath.  Respiratory failure.

EXAM:
PORTABLE CHEST 1 VIEW

[Series 1: chest ap · 0.14mm/px · 2 of 2 slices shown]
[im 1/2]
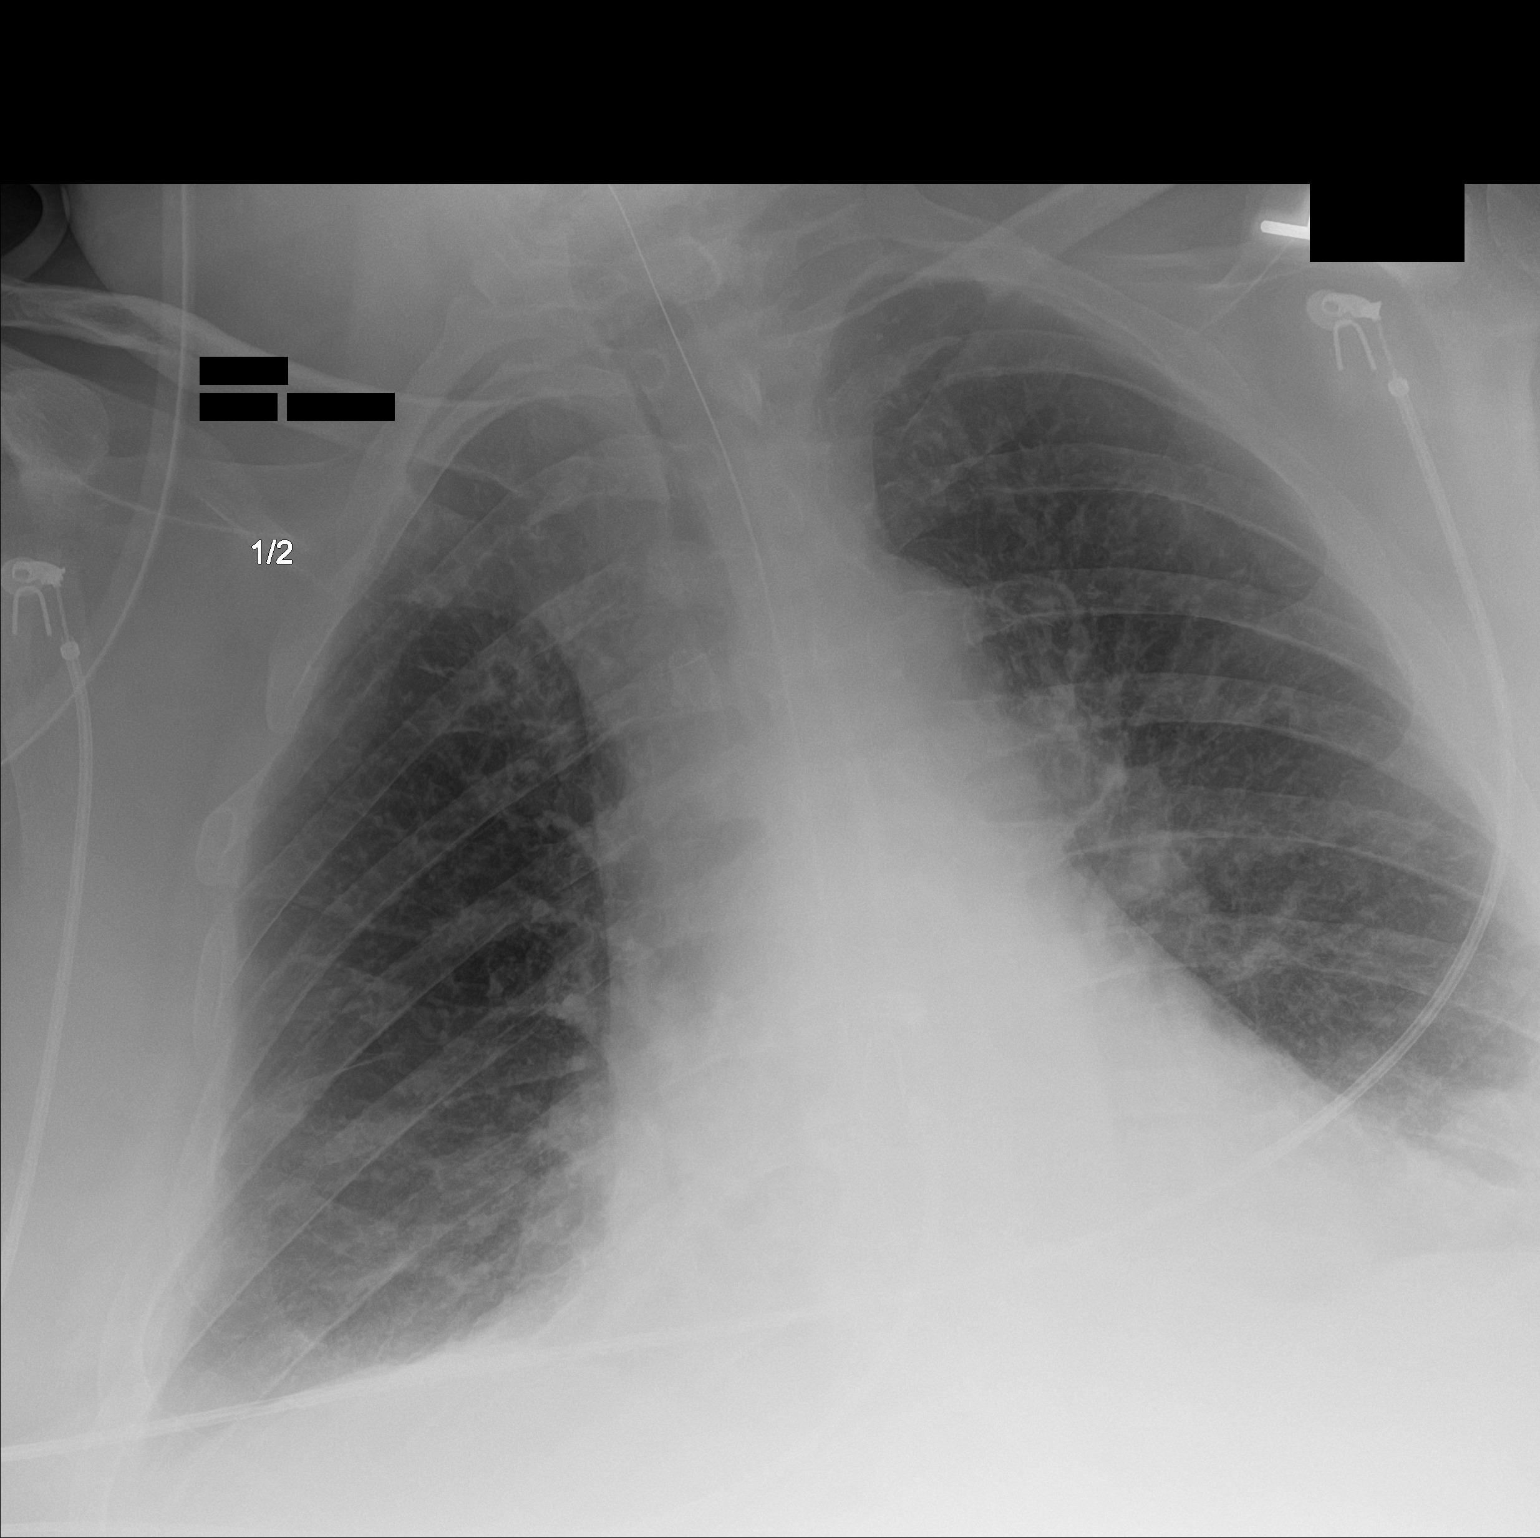
[im 2/2]
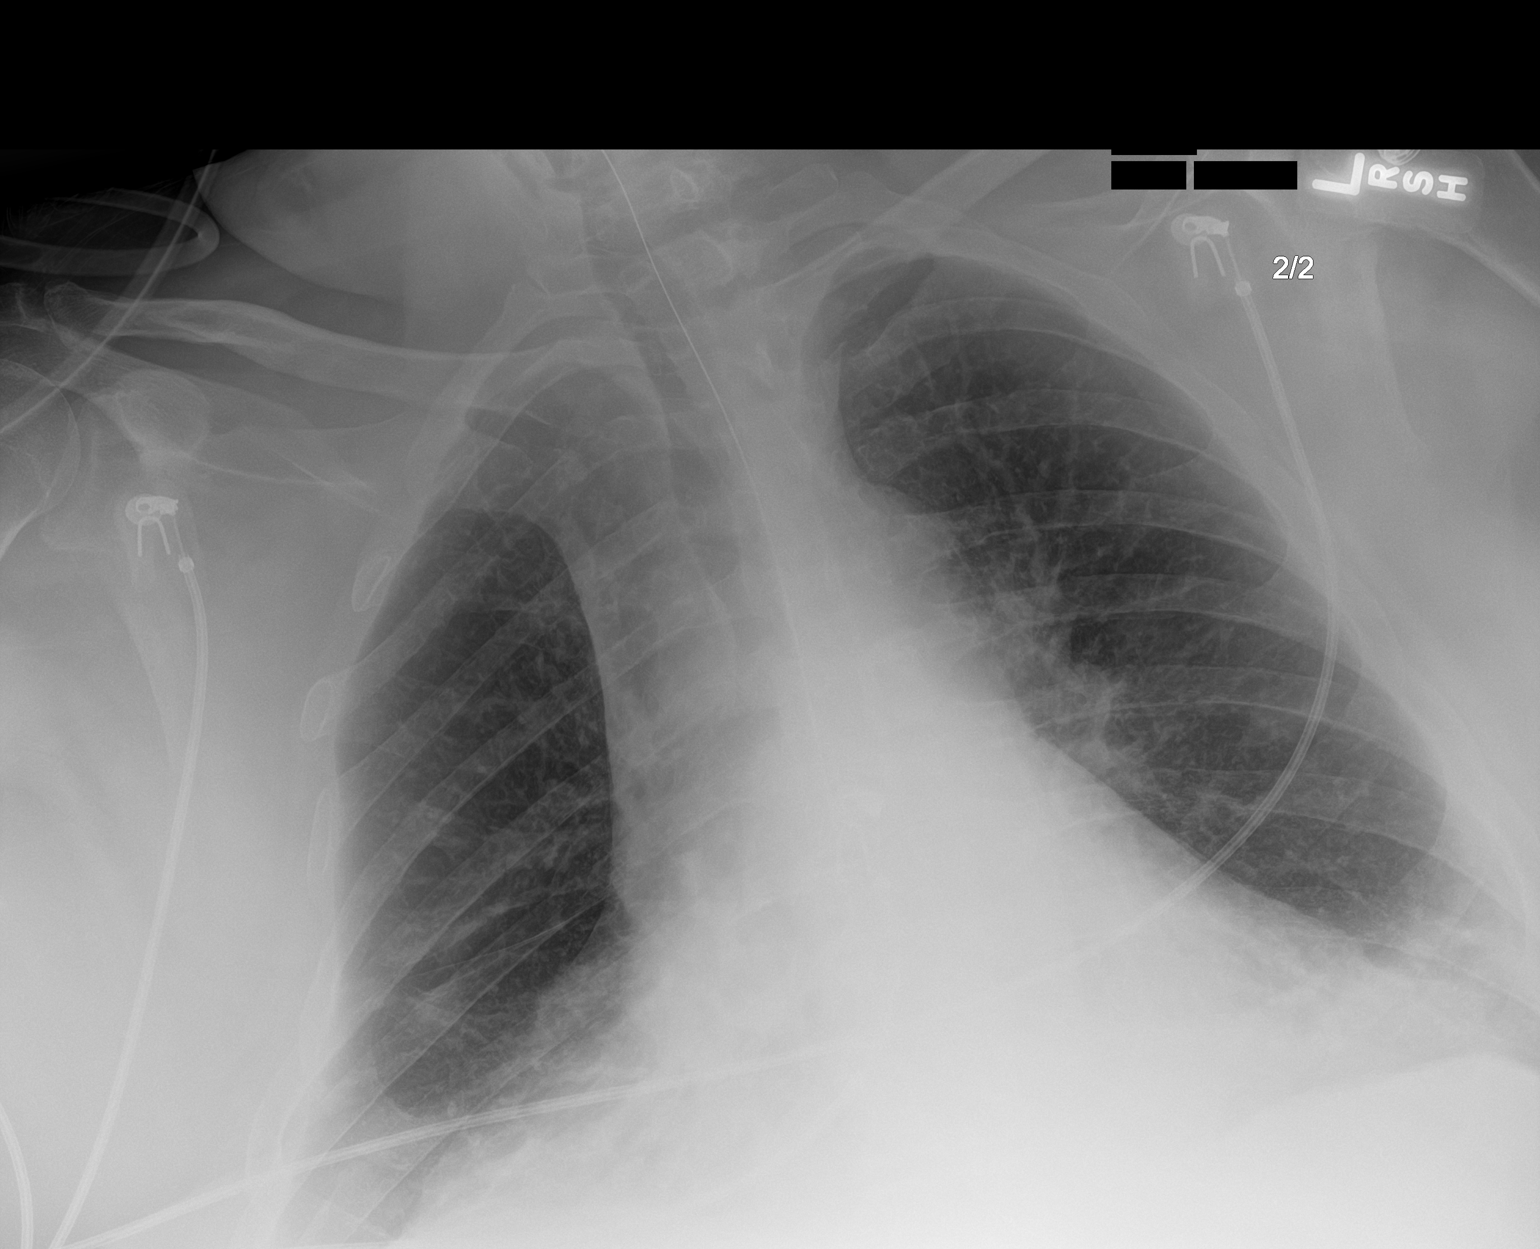

[2 of 2 positions shown; findings below may reference images not displayed]

FINDINGS: Heart size is exaggerate by low lung volumes. NG tube is in place.
Bibasilar airspace disease is noted.
IMPRESSION: 1. Low lung volumes with bibasilar airspace disease. Infection is
not excluded.

## 2020-08-08 IMAGING — DX PORTABLE ABDOMEN - 1 VIEW
1 series · 2 of 2 positions shown · non-contrast
Comparison: None.

CLINICAL DATA: Impaired nasogastric feeding tube, initial
encounter.

EXAM:
PORTABLE ABDOMEN - 1 VIEW

[Series 1: abdomen · 0.14mm/px · 2 of 2 slices shown]
[im 1/2]
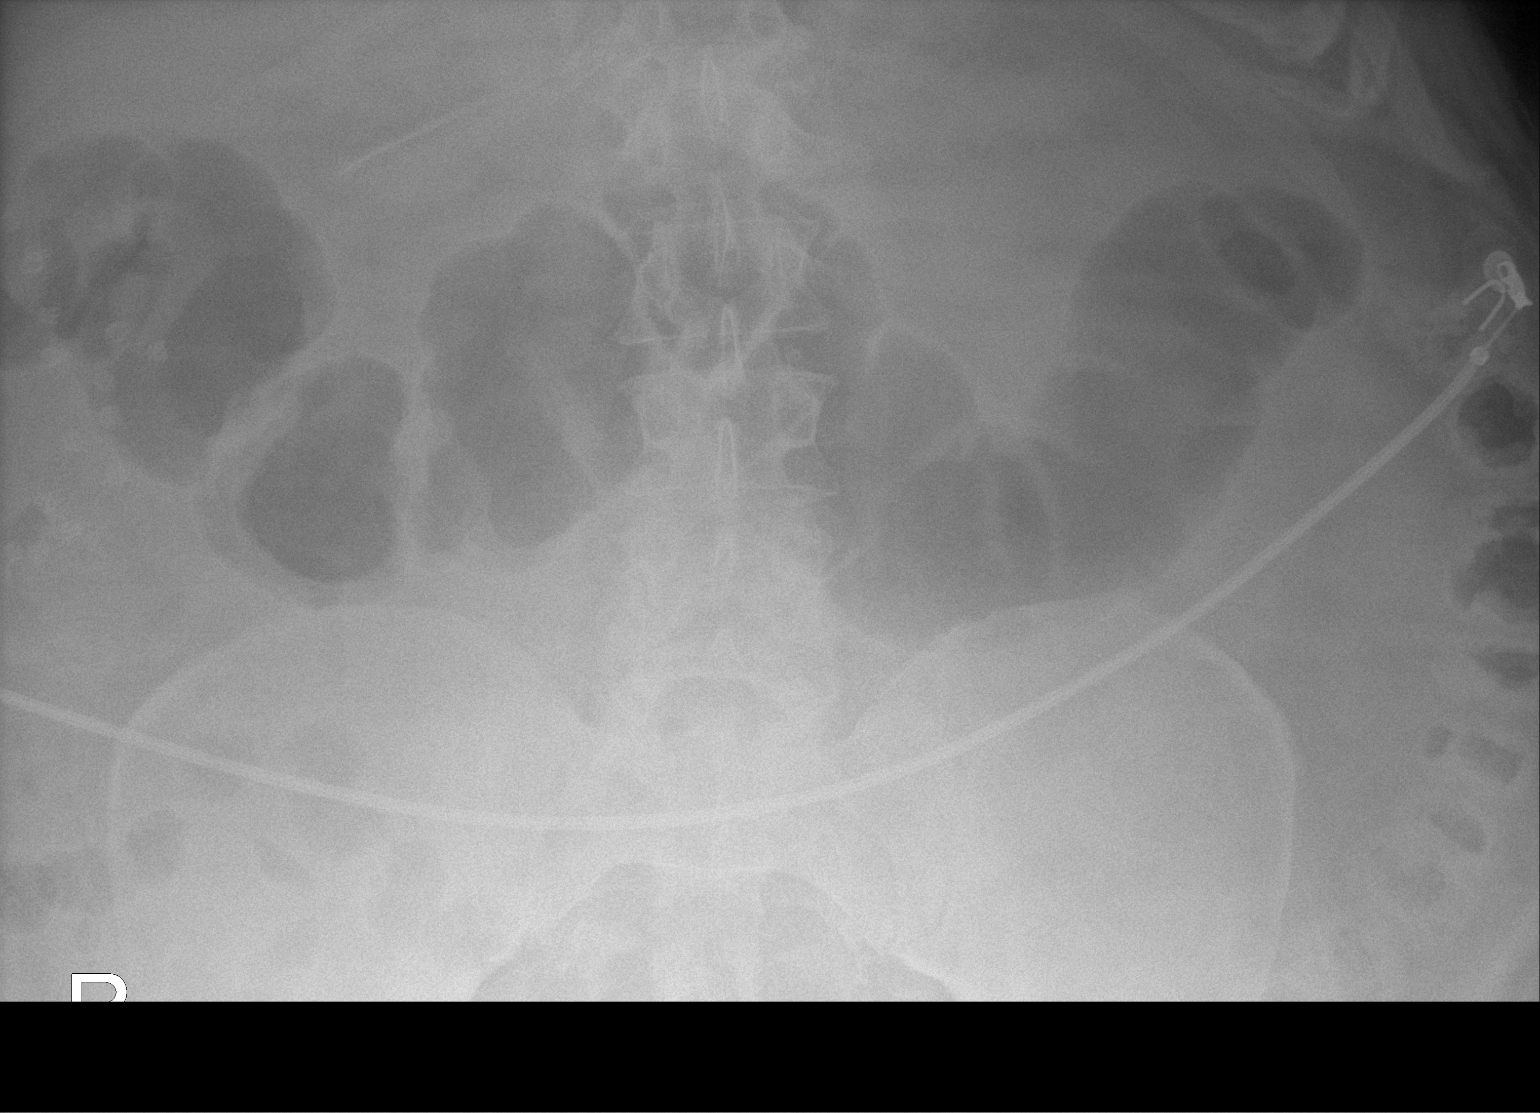
[im 2/2]
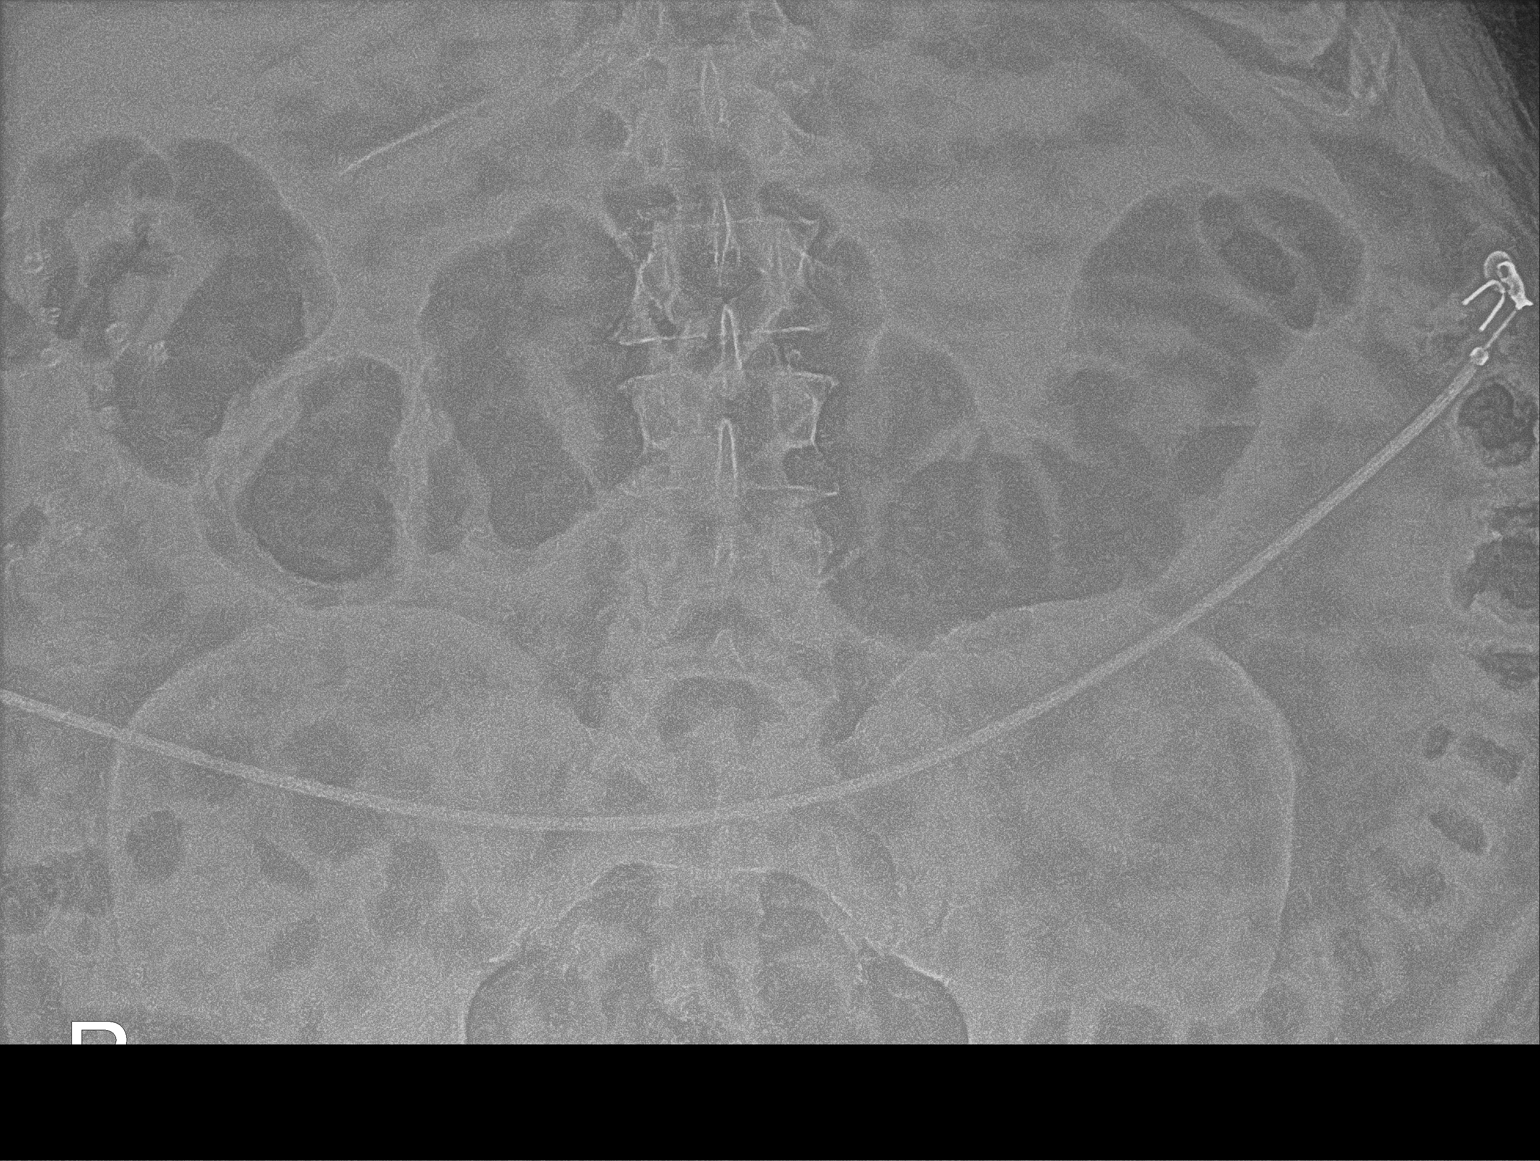

[2 of 2 positions shown; findings below may reference images not displayed]

FINDINGS: The tip of the NG tube is in the antrum of the stomach. Bowel gas
pattern is normal.
IMPRESSION: Tip of the NG tube is identified in the distal stomach.

## 2020-08-16 IMAGING — DX PORTABLE CHEST - 1 VIEW
1 series · 1 of 1 positions shown · non-contrast
Comparison: 04/16/2019

CLINICAL DATA: 62-year-old male with respiratory failure

EXAM:
PORTABLE CHEST 1 VIEW

[chest]
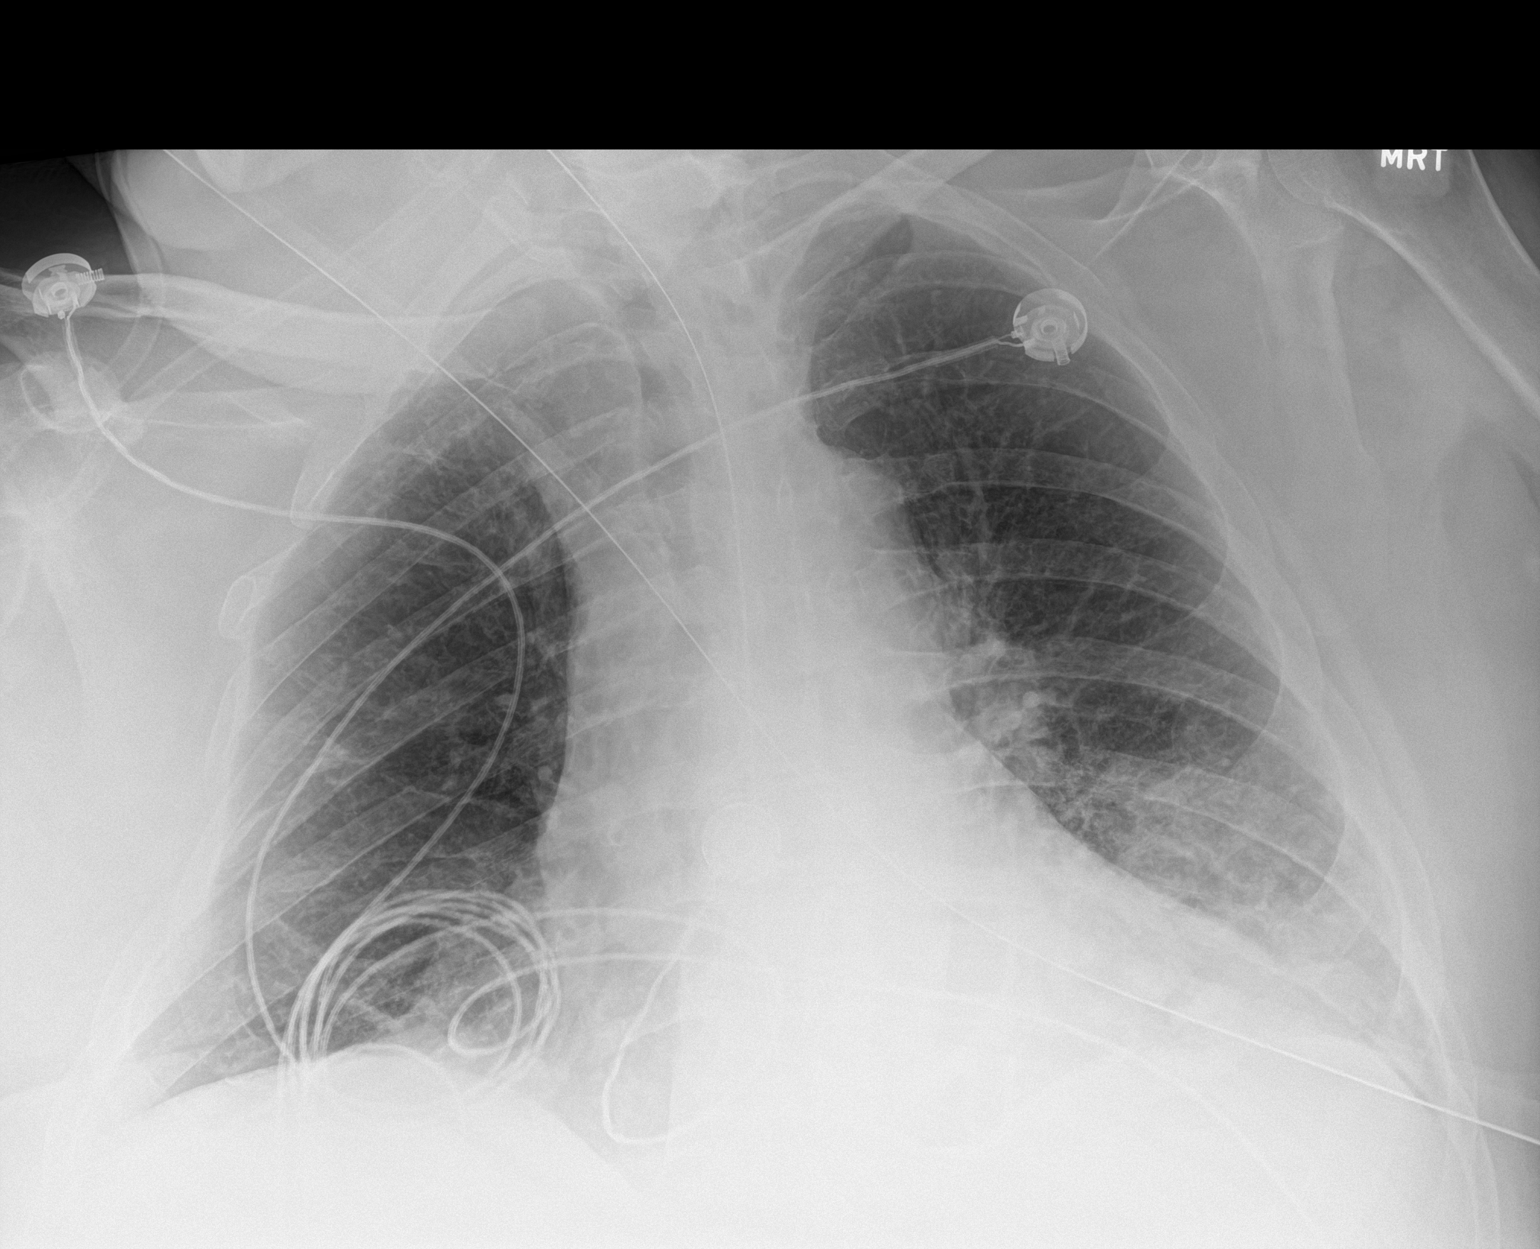

[1 of 1 positions shown; findings below may reference images not displayed]

FINDINGS: Cardiomediastinal silhouette unchanged. Right rotation of the
patient limits evaluation.

Lung volumes are similar to the prior with persistently low lung
volumes. Patchy opacity at the left lung base.

Gastric tube traverses the mediastinum terminating out of the field
of view.

No pneumothorax.  No large pleural effusion.
IMPRESSION: Similar appearance of the chest x-ray with low lung volumes and
patchy left greater than right airspace opacities, potentially
atelectasis/consolidation.

Gastric tube terminates out of the field of view
